# Patient Record
Sex: Female | Born: 1980 | Race: Black or African American | Hispanic: No | Marital: Married | Smoking: Never smoker
Health system: Southern US, Community
[De-identification: ages and names within clinical notes are randomized; demographics above are authoritative.]

## PROBLEM LIST (undated history)

## (undated) DIAGNOSIS — D573 Sickle-cell trait: Secondary | ICD-10-CM

## (undated) HISTORY — DX: Sickle-cell trait: D57.3

## (undated) HISTORY — PX: NO PAST SURGERIES: SHX2092

---

## 2004-05-18 ENCOUNTER — Ambulatory Visit (HOSPITAL_COMMUNITY): Admission: RE | Admit: 2004-05-18 | Discharge: 2004-05-18 | Payer: Self-pay | Admitting: *Deleted

## 2004-07-04 ENCOUNTER — Inpatient Hospital Stay (HOSPITAL_COMMUNITY): Admission: AD | Admit: 2004-07-04 | Discharge: 2004-07-05 | Payer: Self-pay | Admitting: *Deleted

## 2004-07-13 ENCOUNTER — Ambulatory Visit: Payer: Self-pay | Admitting: *Deleted

## 2004-07-13 ENCOUNTER — Inpatient Hospital Stay (HOSPITAL_COMMUNITY): Admission: AD | Admit: 2004-07-13 | Discharge: 2004-07-16 | Payer: Self-pay | Admitting: *Deleted

## 2004-07-13 ENCOUNTER — Ambulatory Visit: Payer: Self-pay | Admitting: Obstetrics and Gynecology

## 2005-07-23 ENCOUNTER — Inpatient Hospital Stay (HOSPITAL_COMMUNITY): Admission: AD | Admit: 2005-07-23 | Discharge: 2005-07-23 | Payer: Self-pay | Admitting: *Deleted

## 2005-09-02 ENCOUNTER — Ambulatory Visit (HOSPITAL_COMMUNITY): Admission: RE | Admit: 2005-09-02 | Discharge: 2005-09-02 | Payer: Self-pay | Admitting: Obstetrics & Gynecology

## 2005-09-03 ENCOUNTER — Encounter: Admission: RE | Admit: 2005-09-03 | Discharge: 2005-09-03 | Payer: Self-pay | Admitting: *Deleted

## 2005-09-10 ENCOUNTER — Ambulatory Visit: Payer: Self-pay | Admitting: *Deleted

## 2005-12-17 ENCOUNTER — Ambulatory Visit (HOSPITAL_COMMUNITY): Admission: RE | Admit: 2005-12-17 | Discharge: 2005-12-17 | Payer: Self-pay | Admitting: Obstetrics & Gynecology

## 2006-02-03 ENCOUNTER — Ambulatory Visit: Payer: Self-pay | Admitting: Certified Nurse Midwife

## 2006-02-03 ENCOUNTER — Inpatient Hospital Stay (HOSPITAL_COMMUNITY): Admission: AD | Admit: 2006-02-03 | Discharge: 2006-02-05 | Payer: Self-pay | Admitting: Obstetrics and Gynecology

## 2007-01-30 ENCOUNTER — Encounter: Admission: RE | Admit: 2007-01-30 | Discharge: 2007-01-30 | Payer: Self-pay | Admitting: *Deleted

## 2009-08-03 ENCOUNTER — Ambulatory Visit (HOSPITAL_COMMUNITY): Admission: RE | Admit: 2009-08-03 | Discharge: 2009-08-03 | Payer: Self-pay | Admitting: Family Medicine

## 2009-08-07 ENCOUNTER — Encounter: Admission: RE | Admit: 2009-08-07 | Discharge: 2009-08-07 | Payer: Self-pay | Admitting: Family Medicine

## 2009-12-21 ENCOUNTER — Inpatient Hospital Stay (HOSPITAL_COMMUNITY): Admission: AD | Admit: 2009-12-21 | Discharge: 2009-12-22 | Payer: Self-pay | Admitting: Family Medicine

## 2009-12-22 ENCOUNTER — Inpatient Hospital Stay (HOSPITAL_COMMUNITY): Admission: AD | Admit: 2009-12-22 | Discharge: 2009-12-24 | Payer: Self-pay | Admitting: Family Medicine

## 2010-07-15 ENCOUNTER — Encounter: Payer: Self-pay | Admitting: *Deleted

## 2010-09-09 LAB — CBC
HCT: 38.5 % (ref 36.0–46.0)
Hemoglobin: 13 g/dL (ref 12.0–15.0)
MCH: 28.8 pg (ref 26.0–34.0)
MCHC: 33.8 g/dL (ref 30.0–36.0)
MCV: 85.3 fL (ref 78.0–100.0)
Platelets: 129 10*3/uL — ABNORMAL LOW (ref 150–400)
RBC: 4.52 MIL/uL (ref 3.87–5.11)
RDW: 15.8 % — ABNORMAL HIGH (ref 11.5–15.5)
WBC: 7.5 10*3/uL (ref 4.0–10.5)

## 2010-09-09 LAB — RPR: RPR Ser Ql: NONREACTIVE

## 2011-07-31 ENCOUNTER — Other Ambulatory Visit: Payer: Self-pay

## 2011-07-31 ENCOUNTER — Encounter (HOSPITAL_BASED_OUTPATIENT_CLINIC_OR_DEPARTMENT_OTHER): Payer: Self-pay | Admitting: Emergency Medicine

## 2011-07-31 ENCOUNTER — Emergency Department (HOSPITAL_BASED_OUTPATIENT_CLINIC_OR_DEPARTMENT_OTHER)
Admission: EM | Admit: 2011-07-31 | Discharge: 2011-08-01 | Disposition: A | Discharge status: Home / Self Care | Payer: Self-pay | Attending: Emergency Medicine | Admitting: Emergency Medicine

## 2011-07-31 ENCOUNTER — Emergency Department (INDEPENDENT_AMBULATORY_CARE_PROVIDER_SITE_OTHER): Payer: Self-pay

## 2011-07-31 DIAGNOSIS — R059 Cough, unspecified: Secondary | ICD-10-CM | POA: Insufficient documentation

## 2011-07-31 DIAGNOSIS — R091 Pleurisy: Secondary | ICD-10-CM

## 2011-07-31 DIAGNOSIS — R05 Cough: Secondary | ICD-10-CM | POA: Insufficient documentation

## 2011-07-31 DIAGNOSIS — R0781 Pleurodynia: Secondary | ICD-10-CM

## 2011-07-31 DIAGNOSIS — M549 Dorsalgia, unspecified: Secondary | ICD-10-CM | POA: Insufficient documentation

## 2011-07-31 DIAGNOSIS — R071 Chest pain on breathing: Secondary | ICD-10-CM | POA: Insufficient documentation

## 2011-07-31 NOTE — ED Provider Notes (Signed)
History     CSN: [id number]  Arrival date & time 07/31/11  2040   First MD Initiated Contact with Patient 07/31/11 2249      Chief Complaint  Patient presents with  . Chest Pain  . Back Pain  . Cough    (Consider location/radiation/quality/duration/timing/severity/associated sxs/prior treatment) HPI Comments: Patient describes several months of right upper back pain. This has been worse with carrying her young baby who she states has to be held constantly. It is also worse when she stretches, bends over or takes a big breath. Until recently she had her husband give her back rub which improve the pain. Symptoms are intermittent, mild to moderate, gradually getting worse, not associated with fevers chills swelling of the legs. She has had a mild cough today but denies any travel, trauma, recent surgery or hormone pill use. She does not smoke cigarettes. She denies swelling of the legs  Patient is a 31 y.o. female presenting with chest pain, back pain, and cough. The history is provided by the patient and the spouse.  Chest Pain Primary symptoms include cough.    Back Pain  Associated symptoms include chest pain.  Cough Associated symptoms include chest pain.    History reviewed. No pertinent past medical history.  History reviewed. No pertinent past surgical history.  No family history on file.  History  Substance Use Topics  . Smoking status: Never Smoker   . Smokeless tobacco: Not on file  . Alcohol Use: No    OB History    Grav Para Term Preterm Abortions TAB SAB Ect Mult Living                  Review of Systems  Respiratory: Positive for cough.   Cardiovascular: Positive for chest pain.  Musculoskeletal: Positive for back pain.  All other systems reviewed and are negative.    Allergies  Review of patient's allergies indicates no known allergies.  Home Medications   Current Outpatient Rx  Name Route Sig Dispense Refill  . NAPROXEN 500 MG PO TABS Oral  Take 1 tablet (500 mg total) by mouth 2 (two) times daily with a meal. 30 tablet 0    BP 129/88  Pulse 85  Temp(Src) 98.7 F (37.1 C) (Oral)  Resp 18  SpO2 100%  LMP 07/21/2011  Physical Exam  Nursing note and vitals reviewed. Constitutional: She appears well-developed and well-nourished. No distress.  HENT:  Head: Normocephalic and atraumatic.  Mouth/Throat: Oropharynx is clear and moist. No oropharyngeal exudate.  Eyes: Conjunctivae and EOM are normal. Pupils are equal, round, and reactive to light. Right eye exhibits no discharge. Left eye exhibits no discharge. No scleral icterus.  Neck: Normal range of motion. Neck supple. No JVD present. No thyromegaly present.  Cardiovascular: Normal rate, regular rhythm, normal heart sounds and intact distal pulses.  Exam reveals no gallop and no friction rub.   No murmur heard. Pulmonary/Chest: Effort normal and breath sounds normal. No respiratory distress. She has no wheezes. She has no rales. She exhibits tenderness.       Tenderness over the sternum, inframammary area over the rib  Abdominal: Soft. Bowel sounds are normal. She exhibits no distension and no mass. There is no tenderness.  Musculoskeletal: Normal range of motion. She exhibits tenderness ( Tender to palpation in the right rhomboid area. She states this reproduces her pain). She exhibits no edema.  Lymphadenopathy:    She has no cervical adenopathy.  Neurological: She is alert. Coordination normal.  Skin: Skin is warm and dry. No rash noted. No erythema.  Psychiatric: She has a normal mood and affect. Her behavior is normal.    ED Course  Procedures (including critical care time)  ED ECG REPORT   Date: 08/01/2011   Rate: 83  Rhythm: normal sinus rhythm  QRS Axis: normal  Intervals: normal  ST/T Wave abnormalities: normal  Conduction Disutrbances:none  Narrative Interpretation: Normal ECG  Old EKG Reviewed: none available      Labs Reviewed - No data to  display Dg Chest 2 View  07/31/2011  *RADIOLOGY REPORT*  Clinical Data: Pleurisy  CHEST - 2 VIEW  Comparison: None.  Findings: Lungs are clear. No pleural effusion or pneumothorax. The cardiomediastinal contours are within normal limits. The visualized bones and soft tissues are without significant appreciable abnormality.  IMPRESSION: No acute cardiopulmonary process.  Original Report Authenticated By: Waneta Martins, M.D.     1. Pleuritic chest pain       MDM  Patient is well-appearing with an exam consistent with musculoskeletal type pain. She has no risk factors for pulmonary embolism, vital signs show oxygen of 100%, pulse of 85, respirations of 18 and afebrile. Lung exam is clear showing no rales wheezing or abnormal findings. EKG is nonischemic and shows no signs of tachycardia or S1 q 3 T3. Chest x-ray to rule out other source of pain.      Patient appears well, chest x-ray as evaluated by myself and radiologist shows no signs of acute findings. Ibuprofen given prior to discharge, Naprosyn for home  Vida Roller, MD 08/01/11 0020

## 2011-07-31 NOTE — ED Notes (Signed)
Pt c/o chest pain and pain between shoulder blades intermittent x 1 week. Pt reports cough starting today. Chest pain and back pain worsen with movement

## 2011-08-01 MED ORDER — NAPROXEN 500 MG PO TABS: 500.0000 mg | ORAL_TABLET | Freq: Two times a day (BID) | ORAL | Status: AC

## 2011-08-01 MED ORDER — IBUPROFEN 800 MG PO TABS
800.0000 mg | ORAL_TABLET | Freq: Once | ORAL | Status: AC
  Administered 2011-08-01: 800 mg via ORAL
  Filled 2011-08-01: qty 1

## 2012-07-01 ENCOUNTER — Encounter (HOSPITAL_COMMUNITY): Payer: Self-pay | Admitting: *Deleted

## 2012-07-07 ENCOUNTER — Ambulatory Visit (HOSPITAL_COMMUNITY)
Admission: RE | Admit: 2012-07-07 | Discharge: 2012-07-07 | Disposition: A | Discharge status: Home / Self Care | Payer: Self-pay | Source: Ambulatory Visit | Attending: Obstetrics and Gynecology | Admitting: Obstetrics and Gynecology

## 2012-07-07 ENCOUNTER — Encounter (HOSPITAL_COMMUNITY): Payer: Self-pay

## 2012-07-07 ENCOUNTER — Other Ambulatory Visit: Payer: Self-pay | Admitting: Obstetrics and Gynecology

## 2012-07-07 VITALS — BP 100/62 | Temp 98.4°F | Ht 64.0 in | Wt 144.8 lb

## 2012-07-07 DIAGNOSIS — N644 Mastodynia: Secondary | ICD-10-CM | POA: Insufficient documentation

## 2012-07-07 DIAGNOSIS — Z1239 Encounter for other screening for malignant neoplasm of breast: Secondary | ICD-10-CM

## 2012-07-07 DIAGNOSIS — N6322 Unspecified lump in the left breast, upper inner quadrant: Secondary | ICD-10-CM | POA: Insufficient documentation

## 2012-07-07 NOTE — Patient Instructions (Signed)
Taught patient how to perform BSE and gave educational materials to take home. Patient did not need a Pap smear today due to last Pap smear was November 2013 per patient. Let her know B[facility]CP will cover Pap smears every 3 years unless has a history of abnormal Pap smears. Referred patient to the [facility] for bilateral diagnostic mammogram and possible ultrasound. Appointment scheduled for Thursday, July 16, 2012 at 0840. Patient aware of appointment and will be there. Patient verbalized understanding.

## 2012-07-07 NOTE — Progress Notes (Signed)
Patient referred to [facility] by the [facility] Department for a left breast lump. Per patient lump has been there since 2007. Patient complained of bilateral breast pain in her right inner breast and her left where the lump is located. Patient stated pain comes and goes and rated at a 6 out of 10.  Pap Smear:    Pap smear not completed today. Per patient last Pap smear was November 2013 at the [facility] Department and normal. Per patient no history of abnormal Pap smears. No Pap smear results is in EPIC.  Physical exam: Breasts Breasts symmetrical. No skin abnormalities bilateral breasts. No nipple retraction bilateral breasts. No nipple discharge bilateral breasts. No lymphadenopathy. No lumps palpated right breast. Palpated lump within the left breast at 10 o'clock 8 cm from the nipple. Patient complained of right inner quadrant breast pain and left breast pain where lump is located. Referred patient to the [facility] for bilateral diagnostic mammogram and possible ultrasound. Appointment scheduled for Thursday, July 16, 2012 at 0840.     Pelvic/Bimanual No Pap smear completed today since last Pap smear was November 2013 per patient. Pap smear not indicated per BCCCP guidelines.

## 2012-07-16 ENCOUNTER — Ambulatory Visit
Admission: RE | Admit: 2012-07-16 | Discharge: 2012-07-16 | Disposition: A | Discharge status: Home / Self Care | Payer: No Typology Code available for payment source | Source: Ambulatory Visit | Attending: Obstetrics and Gynecology | Admitting: Obstetrics and Gynecology

## 2012-07-16 DIAGNOSIS — N6322 Unspecified lump in the left breast, upper inner quadrant: Secondary | ICD-10-CM

## 2012-07-16 DIAGNOSIS — N644 Mastodynia: Secondary | ICD-10-CM

## 2014-04-25 ENCOUNTER — Encounter (HOSPITAL_COMMUNITY): Payer: Self-pay

## 2015-04-06 ENCOUNTER — Other Ambulatory Visit (HOSPITAL_COMMUNITY): Payer: Self-pay | Admitting: Physician Assistant

## 2015-04-06 DIAGNOSIS — Z3A18 18 weeks gestation of pregnancy: Secondary | ICD-10-CM

## 2015-04-06 DIAGNOSIS — Z3689 Encounter for other specified antenatal screening: Secondary | ICD-10-CM

## 2015-04-10 ENCOUNTER — Encounter (HOSPITAL_COMMUNITY): Payer: Self-pay

## 2015-05-09 ENCOUNTER — Encounter (HOSPITAL_COMMUNITY): Payer: Self-pay

## 2015-05-09 ENCOUNTER — Ambulatory Visit (HOSPITAL_COMMUNITY)
Admission: RE | Admit: 2015-05-09 | Discharge: 2015-05-09 | Disposition: A | Discharge status: Home / Self Care | Payer: Self-pay | Source: Ambulatory Visit | Attending: Physician Assistant | Admitting: Physician Assistant

## 2015-05-09 ENCOUNTER — Other Ambulatory Visit (HOSPITAL_COMMUNITY): Payer: Self-pay | Admitting: Physician Assistant

## 2015-05-09 DIAGNOSIS — Z3689 Encounter for other specified antenatal screening: Secondary | ICD-10-CM

## 2015-05-09 DIAGNOSIS — Z3A19 19 weeks gestation of pregnancy: Secondary | ICD-10-CM | POA: Insufficient documentation

## 2015-05-09 DIAGNOSIS — O3412 Maternal care for benign tumor of corpus uteri, second trimester: Secondary | ICD-10-CM

## 2015-05-09 DIAGNOSIS — Z3A18 18 weeks gestation of pregnancy: Secondary | ICD-10-CM

## 2015-05-09 DIAGNOSIS — O09522 Supervision of elderly multigravida, second trimester: Secondary | ICD-10-CM

## 2015-05-09 DIAGNOSIS — Z36 Encounter for antenatal screening of mother: Secondary | ICD-10-CM | POA: Insufficient documentation

## 2015-05-09 DIAGNOSIS — D259 Leiomyoma of uterus, unspecified: Secondary | ICD-10-CM

## 2015-06-25 NOTE — L&D Delivery Note (Cosign Needed)
  Angela Boyle, Angela Boyle [id number]  Delivery Note  Patient arrived to MAU complete, membranes then ruptured, and she delivered shortly thereafter. Dr. Ruthann Cancer immediately available and performed delivery; I assumed care after delivery and managed the remainder.  At  a viable female was delivered via Vaginal, Spontaneous Delivery (Presentation: OA).  APGAR: 7, 9; weight 3660 g.   Placenta status: Intact, Spontaneous.  Cord:  with the following complications: None.  Cord pH: not obtained  Anesthesia: None  Episiotomy: None Lacerations: None Est. Blood Loss (mL):  150  Mom to postpartum.  Baby to Couplet care / Skin to Skin.  [name] 10/01/2015, 7:30 AM

## 2015-10-01 ENCOUNTER — Inpatient Hospital Stay (HOSPITAL_COMMUNITY)
Admission: AD | Admit: 2015-10-01 | Discharge: 2015-10-03 | DRG: 775 | Disposition: A | Discharge status: Home / Self Care | Payer: Medicaid Other | Source: Ambulatory Visit | Attending: Obstetrics & Gynecology | Admitting: Obstetrics & Gynecology

## 2015-10-01 DIAGNOSIS — Z23 Encounter for immunization: Secondary | ICD-10-CM

## 2015-10-01 DIAGNOSIS — Z3A39 39 weeks gestation of pregnancy: Secondary | ICD-10-CM

## 2015-10-01 DIAGNOSIS — O99824 Streptococcus B carrier state complicating childbirth: Secondary | ICD-10-CM | POA: Diagnosis not present

## 2015-10-01 DIAGNOSIS — Z3490 Encounter for supervision of normal pregnancy, unspecified, unspecified trimester: Secondary | ICD-10-CM

## 2015-10-01 LAB — CBC
HCT: 36.1 % (ref 36.0–46.0)
HEMOGLOBIN: 12.6 g/dL (ref 12.0–15.0)
MCH: 27.5 pg (ref 26.0–34.0)
MCHC: 34.9 g/dL (ref 30.0–36.0)
MCV: 78.6 fL (ref 78.0–100.0)
Platelets: 129 10*3/uL — ABNORMAL LOW (ref 150–400)
RBC: 4.59 MIL/uL (ref 3.87–5.11)
RDW: 15.7 % — ABNORMAL HIGH (ref 11.5–15.5)
WBC: 5.4 10*3/uL (ref 4.0–10.5)

## 2015-10-01 LAB — RPR: RPR: NONREACTIVE

## 2015-10-01 MED ORDER — ONDANSETRON HCL 4 MG PO TABS: 4.0000 mg | ORAL_TABLET | ORAL | Status: DC | PRN

## 2015-10-01 MED ORDER — BENZOCAINE-MENTHOL 20-0.5 % EX AERO
1.0000 "application " | INHALATION_SPRAY | CUTANEOUS | Status: DC | PRN
  Filled 2015-10-01: qty 56

## 2015-10-01 MED ORDER — OXYTOCIN 10 UNIT/ML IJ SOLN
INTRAMUSCULAR | Status: AC
  Filled 2015-10-01: qty 1

## 2015-10-01 MED ORDER — ONDANSETRON HCL 4 MG/2ML IJ SOLN: 4.0000 mg | INTRAMUSCULAR | Status: DC | PRN

## 2015-10-01 MED ORDER — TETANUS-DIPHTH-ACELL PERTUSSIS 5-2.5-18.5 LF-MCG/0.5 IM SUSP: 0.5000 mL | Freq: Once | INTRAMUSCULAR | Status: DC

## 2015-10-01 MED ORDER — ACETAMINOPHEN 325 MG PO TABS
650.0000 mg | ORAL_TABLET | ORAL | Status: DC | PRN
  Administered 2015-10-01 (×2): 650 mg via ORAL
  Filled 2015-10-01 (×2): qty 2

## 2015-10-01 MED ORDER — OXYCODONE-ACETAMINOPHEN 5-325 MG PO TABS
2.0000 | ORAL_TABLET | ORAL | Status: DC | PRN
  Filled 2015-10-01: qty 2

## 2015-10-01 MED ORDER — OXYCODONE-ACETAMINOPHEN 5-325 MG PO TABS: 1.0000 | ORAL_TABLET | ORAL | Status: DC | PRN

## 2015-10-01 MED ORDER — ONDANSETRON HCL 4 MG/2ML IJ SOLN: 4.0000 mg | Freq: Four times a day (QID) | INTRAMUSCULAR | Status: DC | PRN

## 2015-10-01 MED ORDER — LANOLIN HYDROUS EX OINT: TOPICAL_OINTMENT | CUTANEOUS | Status: DC | PRN

## 2015-10-01 MED ORDER — WITCH HAZEL-GLYCERIN EX PADS: 1.0000 "application " | MEDICATED_PAD | CUTANEOUS | Status: DC | PRN

## 2015-10-01 MED ORDER — IBUPROFEN 600 MG PO TABS
600.0000 mg | ORAL_TABLET | Freq: Four times a day (QID) | ORAL | Status: DC
  Administered 2015-10-01 – 2015-10-03 (×7): 600 mg via ORAL
  Filled 2015-10-01 (×8): qty 1

## 2015-10-01 MED ORDER — MEASLES, MUMPS & RUBELLA VAC ~~LOC~~ INJ
0.5000 mL | INJECTION | Freq: Once | SUBCUTANEOUS | Status: DC
  Filled 2015-10-01: qty 0.5

## 2015-10-01 MED ORDER — DIPHENHYDRAMINE HCL 25 MG PO CAPS: 25.0000 mg | ORAL_CAPSULE | Freq: Four times a day (QID) | ORAL | Status: DC | PRN

## 2015-10-01 MED ORDER — DIBUCAINE 1 % RE OINT
1.0000 "application " | TOPICAL_OINTMENT | RECTAL | Status: DC | PRN
  Filled 2015-10-01: qty 28

## 2015-10-01 MED ORDER — ZOLPIDEM TARTRATE 5 MG PO TABS: 5.0000 mg | ORAL_TABLET | Freq: Every evening | ORAL | Status: DC | PRN

## 2015-10-01 MED ORDER — SIMETHICONE 80 MG PO CHEW
80.0000 mg | CHEWABLE_TABLET | ORAL | Status: DC | PRN
  Filled 2015-10-01: qty 1

## 2015-10-01 MED ORDER — LACTATED RINGERS IV SOLN: INTRAVENOUS | Status: DC

## 2015-10-01 MED ORDER — PRENATAL MULTIVITAMIN CH
1.0000 | ORAL_TABLET | Freq: Every day | ORAL | Status: DC
  Administered 2015-10-01 – 2015-10-02 (×2): 1 via ORAL
  Filled 2015-10-01 (×3): qty 1

## 2015-10-01 MED ORDER — OXYTOCIN 10 UNIT/ML IJ SOLN: 2.5000 [IU]/h | INTRAVENOUS | Status: DC

## 2015-10-01 MED ORDER — LACTATED RINGERS IV SOLN: 500.0000 mL | INTRAVENOUS | Status: DC | PRN

## 2015-10-01 MED ORDER — SENNOSIDES-DOCUSATE SODIUM 8.6-50 MG PO TABS
2.0000 | ORAL_TABLET | ORAL | Status: DC
  Administered 2015-10-02 (×2): 2 via ORAL
  Filled 2015-10-01 (×2): qty 2

## 2015-10-01 MED ORDER — SODIUM CHLORIDE 0.9 % IV SOLN
2.0000 g | Freq: Once | INTRAVENOUS | Status: DC
  Filled 2015-10-01: qty 2000

## 2015-10-01 MED ORDER — OXYTOCIN BOLUS FROM INFUSION: 500.0000 mL | INTRAVENOUS | Status: DC

## 2015-10-01 MED ORDER — LIDOCAINE HCL (PF) 1 % IJ SOLN: 30.0000 mL | INTRAMUSCULAR | Status: DC | PRN

## 2015-10-01 MED ORDER — CITRIC ACID-SODIUM CITRATE 334-500 MG/5ML PO SOLN: 30.0000 mL | ORAL | Status: DC | PRN

## 2015-10-01 MED ORDER — ACETAMINOPHEN 325 MG PO TABS: 650.0000 mg | ORAL_TABLET | ORAL | Status: DC | PRN

## 2015-10-01 MED ORDER — FLEET ENEMA 7-19 GM/118ML RE ENEM: 1.0000 | ENEMA | RECTAL | Status: DC | PRN

## 2015-10-01 NOTE — MAU Note (Signed)
Pt presents to MAU with complaints of contractions that started at 5am. Denies any vaginal bleeding or LOF.

## 2015-10-01 NOTE — MAU Note (Signed)
Pt ROM with meconium fluid, urge to push, delivered viable female infant at [phone]

## 2015-10-01 NOTE — MAU Note (Signed)
Report given to [facility]

## 2015-10-01 NOTE — H&P (Cosign Needed)
LABOR AND DELIVERY ADMISSION HISTORY AND PHYSICAL NOTE  Leshea Labarr is a 35 y.o. female [id number] with IUP at [redacted]w[redacted]d by L/19 presenting for painful contractions.   She reports positive fetal movement. She denies leakage of fluid or vaginal bleeding.  Prenatal History/Complications:  Past Medical History: No past medical history on file.  Past Surgical History: No past surgical history on file.  Obstetrical History: OB History    Gravida Para Term Preterm AB TAB SAB Ectopic Multiple Living   5 4 4  0 0 0 0 0  4      Social History: Social History   Social History  . Marital Status: Married    Spouse Name: N/A  . Number of Children: N/A  . Years of Education: N/A   Social History Main Topics  . Smoking status: Never Smoker   . Smokeless tobacco: Never Used  . Alcohol Use: No  . Drug Use: No  . Sexual Activity: Not on file   Other Topics Concern  . Not on file   Social History Narrative   ** Merged History Encounter **        Family History: Family History  Problem Relation Age of Onset  . Hypertension Mother     Allergies: No Known Allergies  Prescriptions prior to admission  Medication Sig Dispense Refill Last Dose  . Multiple Vitamin (MULTIVITAMIN) tablet Take 1 tablet by mouth daily.        Review of Systems   All systems reviewed and negative except as stated in HPI  Last menstrual period 12/28/2014. General appearance: alert, cooperative, appears stated age and moderate distress Lungs: clear to auscultation bilaterally Heart: regular rate and rhythm Abdomen: soft, non-tender; bowel sounds normal Extremities: No calf swelling or tenderness Presentation: cephalic Fetal monitoring: 140s Uterine activity: q  3 min     Prenatal labs: ABO, Rh:  b pos Antibody:  neg Rubella: !Error!imm RPR:   neg HBsAg:   pos HIV:   neg GBS:   pos 1 hr Glucola:  85 Genetic screening:  Quad neg Anatomy US: wnl  Prenatal Transfer Tool  Maternal  Diabetes: No Genetic Screening: Normal Maternal Ultrasounds/Referrals: Normal Fetal Ultrasounds or other Referrals:  None Maternal Substance Abuse:  No Significant Maternal Medications:  None Significant Maternal Lab Results: Lab values include: Group B Strep positive, HBsAG positive  No results found for this or any previous visit (from the past 24 hour(s)).  Patient Active Problem List   Diagnosis Date Noted  . Pregnancy 10/01/2015  . Breast lump on left side at 10 o'clock position 07/07/2012  . Breast pain in female 07/07/2012    Assessment: Joanny Vanderheiden is a 35 y.o. [id number] at [redacted]w[redacted]d here for active labor. Arrived to MAU complete and delivered a few minutes later, uncomplicated delivery.  #Labor: pitocin 10 units IM  #ID:  gbs pos, not treated #MOF: breast/bottle #MOC: need to inquire #Circ:  N/a #hep b: peds made aware through prenatal transfer tool  Desma Maxim 10/01/2015, 7:28 AM

## 2015-10-01 NOTE — Lactation Note (Signed)
This note was copied from a baby's chart. Lactation Consultation Note  Patient Name: Angela Boyle Today's Date: 10/01/2015 Reason for consult: Initial assessment Baby at 42 hr of life and mom reports bf is going well. Upon entry mom was eating and baby was sleeping. She bf all 4 of her other children over 12 months. She stated that on day 2 with each of her other children her milk comes in and her nipples go flat. She stated that she uses a hand pump to get the nipple more erect and the babies latch with no issues. Given Harmony. She denies breast or nipple pain, voiced no concerns. Discussed baby behavior, feeding frequency, baby belly size, voids, wt loss, breast changes, and nipple care. Demonstrated manual expression, colostrum noted bilaterally, spoon in room. Given lactation handouts. Aware of OP services and support group. Mom will call as needed.      Maternal Data Has patient been taught Hand Expression?: Yes Does the patient have breastfeeding experience prior to this delivery?: Yes  Feeding Feeding Type: Breast Fed Length of feed: 35 min  LATCH Score/Interventions                      Lactation Tools Discussed/Used WIC Program: No Pump Review: Setup, frequency, and cleaning;Milk Storage Initiated by:: ES Date initiated:: 10/01/15   Consult Status Consult Status: PRN    Denzil Hughes 10/01/2015, 6:56 PM

## 2015-10-02 ENCOUNTER — Encounter (HOSPITAL_COMMUNITY): Payer: Self-pay | Admitting: *Deleted

## 2015-10-02 NOTE — Progress Notes (Signed)
Post Partum Day 1 Subjective: no complaints, up ad lib, voiding and tolerating PO  Objective: Blood pressure 118/72, pulse 72, temperature 98.5 F (36.9 C), temperature source Oral, resp. rate 17, last menstrual period 12/28/2014.  Physical Exam:  General: alert, cooperative, appears stated age and no distress Lochia: appropriate Uterine Fundus: firm Incision: n/a DVT Evaluation: No evidence of DVT seen on physical exam. Negative Homan's sign. No cords or calf tenderness.   Recent Labs  10/01/15 0729  HGB 12.6  HCT 36.1    Assessment/Plan: Plan for discharge tomorrow   LOS: 1 day   Koren Shiver DARLENE 10/02/2015, 6:24 AM

## 2015-10-03 MED ORDER — IBUPROFEN 600 MG PO TABS: 600.0000 mg | ORAL_TABLET | Freq: Four times a day (QID) | ORAL | Status: DC

## 2015-10-03 NOTE — Lactation Note (Signed)
This note was copied from a baby's chart. Lactation Consultation Note  Patient Name: Angela Boyle Today's Date: 10/03/2015 Reason for consult: Follow-up assessment   With this experienced breast feeding mom - this is her 5th time breastfeeidng. She denies andy questions/concerns, but knows to call lactation if she does.    Maternal Data    Feeding Feeding Type: Breast Fed  LATCH Score/Interventions Latch: Grasps breast easily, tongue down, lips flanged, rhythmical sucking.  Audible Swallowing: Spontaneous and intermittent  Type of Nipple: Everted at rest and after stimulation  Comfort (Breast/Nipple): Soft / non-tender     Hold (Positioning): Assistance needed to correctly position infant at breast and maintain latch. Intervention(s): Support Pillows  LATCH Score: 9  Lactation Tools Discussed/Used     Consult Status Consult Status: Complete    Tonna Corner 10/03/2015, 11:13 AM

## 2015-10-03 NOTE — Discharge Summary (Signed)
OB Discharge Summary  Patient Name: Angela Boyle DOB: 12-Dec-1980 MRN: [id number]  Date of admission: 10/01/2015 Delivering MD: Gracy Racer A   Date of discharge: 10/03/2015  Admitting diagnosis: 39WKS, CONTRACTIONS 3-4MINS  Intrauterine pregnancy: [redacted]w[redacted]d     Secondary diagnosis:Active Problems:   Pregnancy   Term pregnancy delivered  Additional problems: Arrived in active labor and delivered in the MAU a few minutes later, uncomplicated delivery. Pt was GBS positive and was untreated.    Discharge diagnosis: Term Pregnancy Delivered                                                                     Post partum procedures:none  Augmentation: none  Complications: None  Hospital course:  Onset of Labor With Vaginal Delivery     35 y.o. yo G5P5005 at [redacted]w[redacted]d was admitted in Active Labor on 10/01/2015. Patient had an uncomplicated labor course as follows:  Membrane Rupture Time/Date: 7:12 AM ,10/01/2015   Intrapartum Procedures: Episiotomy: None [1]                                         Lacerations:  None [1]  Patient had a delivery of a Viable infant. 10/01/2015  Information for the patient's newborn:  Angela, Boyle Angela Boyle [id number]        Pateint had an uncomplicated postpartum course.  She is ambulating, tolerating a regular diet, passing flatus, and urinating well. Patient is discharged home in stable condition on 10/03/2015.    Physical exam  Filed Vitals:   10/01/15 2104 10/02/15 0500 10/02/15 1710 10/03/15 0538  BP: 107/59 118/72 125/57 112/62  Pulse: 79 72 67 63  Temp: 98.9 F (37.2 C) 98.5 F (36.9 C) 98.9 F (37.2 C) 98.6 F (37 C)  TempSrc:  Oral Oral Oral  Resp: 18 17 18 16    General: alert, cooperative and no distress Lochia: appropriate Uterine Fundus: firm Incision: N/A DVT Evaluation: No evidence of DVT seen on physical exam. Labs: Lab Results  Component Value Date   WBC 5.4 10/01/2015   HGB 12.6 10/01/2015   HCT 36.1 10/01/2015    MCV 78.6 10/01/2015   PLT 129* 10/01/2015   No flowsheet data found.  Discharge instruction: per After Visit Summary and "Baby and Me Booklet".  After Visit Meds:    Medication List    TAKE these medications        ibuprofen 600 MG tablet  Commonly known as:  ADVIL,MOTRIN  Take 1 tablet (600 mg total) by mouth every 6 (six) hours.     prenatal multivitamin Tabs tablet  Take 1 tablet by mouth daily at 12 noon.     ranitidine 150 MG tablet  Commonly known as:  ZANTAC  Take 150 mg by mouth 2 (two) times daily.        Diet: routine diet  Activity: Advance as tolerated. Pelvic rest for 6 weeks.   Outpatient follow up:6 weeks Follow up Appt:No future appointments. Follow up visit: No Follow-up on file.  Postpartum contraception: Combination OCPs (Yasmin)  Newborn Data: Live born female  Birth Weight: 8 lb 1.1 oz (3660 g) APGAR:  7, 9  Baby Feeding: Bottle and Breast Disposition:home with mother   10/03/2015 Angela Boyle, CNM

## 2015-10-03 NOTE — Discharge Instructions (Signed)

## 2015-10-03 NOTE — Discharge Summary (Signed)
OB Discharge Summary  Patient Name: Angela Boyle DOB: [birth year] MRN: [id number]  Date of admission: 10/01/2015 Delivering MD: Gracy Racer A   Date of discharge: 10/03/2015  Admitting diagnosis: 39WKS, CONTRACTIONS 3-4MINS  Intrauterine pregnancy: [redacted]w[redacted]d     Secondary diagnosis:Active Problems:   Pregnancy   Term pregnancy delivered  Additional problems: Arrived in active labor and delivered in the MAU a few minutes later, uncomplicated delivery. Pt was GBS positive and was untreated.    Discharge diagnosis: Term Pregnancy Delivered                                                                     Post partum procedures:none  Augmentation: none  Complications: None  Hospital course:  Onset of Labor With Vaginal Delivery     35 y.o. yo G5P5005 at [redacted]w[redacted]d was admitted in Active Labor on 10/01/2015. Patient had an uncomplicated labor course as follows:  Membrane Rupture Time/Date: 7:12 AM ,10/01/2015   Intrapartum Procedures: Episiotomy: None [1]                                         Lacerations:  None [1]  Patient had a delivery of a Viable infant. 10/01/2015  Information for the patient's newborn:  Ayani, Hackl Girl Emillee [id number]       Pateint had an uncomplicated postpartum course.  She is ambulating, tolerating a regular diet, passing flatus, and urinating well. Patient is discharged home in stable condition on 10/03/2015.    Physical exam  Filed Vitals:   10/01/15 2104 10/02/15 0500 10/02/15 1710 10/03/15 0538  BP: 107/59 118/72 125/57 112/62  Pulse: 79 72 67 63  Temp: 98.9 F (37.2 C) 98.5 F (36.9 C) 98.9 F (37.2 C) 98.6 F (37 C)  TempSrc:  Oral Oral Oral  Resp: 18 17 18 16    General: alert, cooperative and no distress Lochia: appropriate Uterine Fundus: firm Incision: N/A DVT Evaluation: No evidence of DVT seen on physical exam. Labs: Lab Results  Component Value Date   WBC 5.4 10/01/2015   HGB 12.6 10/01/2015   HCT 36.1 10/01/2015    MCV 78.6 10/01/2015   PLT 129* 10/01/2015   No flowsheet data found.  Discharge instruction: per After Visit Summary and "Baby and Me Booklet".  After Visit Meds:    Medication List    TAKE these medications        ibuprofen 600 MG tablet  Commonly known as:  ADVIL,MOTRIN  Take 1 tablet (600 mg total) by mouth every 6 (six) hours.     prenatal multivitamin Tabs tablet  Take 1 tablet by mouth daily at 12 noon.     ranitidine 150 MG tablet  Commonly known as:  ZANTAC  Take 150 mg by mouth 2 (two) times daily.        Diet: routine diet  Activity: Advance as tolerated. Pelvic rest for 6 weeks.   Outpatient follow up:6 weeks Follow up Appt:No future appointments. Follow up visit: No Follow-up on file.  Postpartum contraception: Combination OCPs (Yasmin)  Newborn Data: Live born female  Birth Weight: 8 lb 1.1 oz (3660 g) APGAR: 7,  9  Baby Feeding: Bottle and Breast Disposition:home with mother   10/03/2015 Evette Doffing, MD   CNM attestation:  I have seen and examined this patient; I agree with above documentation in the Resident's note.    Lakeria Starkman Grissett, CNM 10:02 AM

## 2015-10-04 LAB — TYPE AND SCREEN
ABO/RH(D): B POS
Antibody Screen: POSITIVE
DAT, IGG: NEGATIVE
DONOR AG TYPE: NEGATIVE
Donor AG Type: NEGATIVE
PT AG TYPE: NEGATIVE
UNIT DIVISION: 0
Unit division: 0

## 2016-09-10 ENCOUNTER — Encounter: Payer: Self-pay | Admitting: Internal Medicine

## 2016-09-10 ENCOUNTER — Ambulatory Visit (INDEPENDENT_AMBULATORY_CARE_PROVIDER_SITE_OTHER): Payer: Self-pay | Admitting: Internal Medicine

## 2016-09-10 VITALS — BP 118/80 | HR 76 | Resp 12 | Ht 63.0 in | Wt 156.0 lb

## 2016-09-10 DIAGNOSIS — D573 Sickle-cell trait: Secondary | ICD-10-CM | POA: Insufficient documentation

## 2016-09-10 DIAGNOSIS — E010 Iodine-deficiency related diffuse (endemic) goiter: Secondary | ICD-10-CM

## 2016-09-10 DIAGNOSIS — Z3009 Encounter for other general counseling and advice on contraception: Secondary | ICD-10-CM

## 2016-09-10 NOTE — Patient Instructions (Addendum)
Can google "advance directives, [place]"  And bring up form from Secretary of [place]. Print and fill out Or can go to "5 wishes"  Which is also in Spanish and fill out--this costs $5--perhaps easier to use. Designate a Forensic scientist to speak for you if you are unable to speak for yourself when ill or injured  Call for illness you would like to be seen for or any health concern.

## 2016-09-10 NOTE — Progress Notes (Signed)
LCSW screened new pt for behavioral health concerns and social determinants of health. Pt shared that she is often tired due to parenting 5 children, but has no other stress or concerns at this time. LCSW shared the social work services available at the clinic if needed.

## 2016-09-10 NOTE — Progress Notes (Signed)
   Subjective:    Patient ID: Angela Boyle, female    DOB: Oct 22, 1980, 36 y.o.   MRN: [id number]  HPI   Here to establish  Family Planning:  Had an IUD before--had cramps and heavy bleeding.  Had prolonged bleeding with Depo Provera.  Prefers the pills.  She was on Micronor postpartum, but stopped when her baby girl was 4-5 months.  She is almost 12 months now.   She is not certain if her husband would consider vasectomy. She plans to go back to [facility] gynecology clinic, which is free as are the BCPs.   Current Meds  Medication Sig  . Multiple Vitamins-Calcium (ONE-A-DAY WOMENS FORMULA) TABS Take 1 tablet by mouth daily.   No Known Allergies   Past Medical History:  Diagnosis Date  . Sickle cell trait ([place])     History reviewed. No pertinent surgical history.   Family History  Problem Relation Age of Onset  . Hypertension Mother   . Hyperlipidemia Mother   . Hypertension Father   . Hypertension Brother   . Sickle cell trait Daughter   . Sickle cell trait Daughter     Social History   Social History  . Marital status: Married    Spouse name: Abdou  . Number of children: 5  . Years of education: 12   Occupational History  . 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 Smoking status: Never Smoker  . Smokeless tobacco: Never Used  . Alcohol use No  . Drug use: No  . Sexual activity: Yes    Birth control/ protection: Condom   Other Topics Concern  . Not on file   Social History Narrative   ** Merged History Encounter **   Originally from [place]   Came to [place] In 2005   Lives at home with her husband and 4 youngest children.   Oldest son lives in [place] with her mother.         Review of Systems     Objective:   Physical Exam  NAD HEENT:  PERRL, EOMI, TMs pearly gray, throat without injection, several missing teeth--states were pulled due to decay. Neck:  Supple, No adenopathy, possible thyromegaly vs. Redundant soft tissue overlying  thryoid. Chest:  CTA CV:  RRR with normal S1 and S2, No S3, S4 or murmur, radial and DP pulses normal and equal Abd:  S, NT, No HSM or mass, + BS       Assessment & Plan:  1.  Family Planning:  After discussion regarding $10 cost per month for Micronor at [facility], patient decides to keep her appt. With [facility] where she can obtain BCPs for free.  Encouraged her to make that appt.  Also discussed possible vasectomy for husband, but not clear if that would be covered with Urology through orange card.  She can certainly check to see if available through PHD as well.  2.  Possible thyromegaly:  TSH.

## 2016-09-11 LAB — TSH: TSH: 1.18 u[IU]/mL (ref 0.450–4.500)

## 2016-10-17 ENCOUNTER — Encounter (HOSPITAL_COMMUNITY): Payer: Self-pay

## 2019-06-25 NOTE — L&D Delivery Note (Addendum)
Angela Boyle is a 38 y.o.@ s/p vaginal delivery at [redacted]w[redacted]d.  She was admitted for labor in the setting IOL-iso-E immunization.    ROM: clear fluid GBS Status: negative Maximum Maternal Temperature: 99.5 F   Labor Progress: Pt in latent labor on admission, admitted for IOL. She continued to progress well without augmentation. Pitocin was also initiated and pt then was noted to have complete cervical dilation. She then delivered without complication as noted below.   Delivery Date/Time:  Delivery: Called to room and patient was complete and pushing. Head delivered ROA. No nuchal cord present. Shoulder and body delivered in usual fashion. Infant with spontaneous cry, placed on mother's abdomen, dried and stimulated. Cord clamped x 2 after 1-minute delay, and cut by me. Cord blood drawn. Placenta delivered spontaneously with gentle cord traction. Fundus firm with massage and Pitocin. Labia, perineum, vagina, and cervix were inspected; no lacerations visualized.    Placenta: intact, 3-vessel cord, sent to L&D Complications: none Lacerations: none EBL: 100 ml Analgesia: epidural   Infant: female  APGARs 9 & 9  weight per medical record  Donney Dice, DO  Midwife attestation: I was gloved and present for delivery in its entirety and I agree with the above resident's note.  Fatima Blank, CNM 9:25 AM

## 2020-01-03 ENCOUNTER — Other Ambulatory Visit: Payer: Self-pay | Admitting: Family

## 2020-01-03 DIAGNOSIS — Z363 Encounter for antenatal screening for malformations: Secondary | ICD-10-CM

## 2020-01-03 DIAGNOSIS — Z3A19 19 weeks gestation of pregnancy: Secondary | ICD-10-CM

## 2020-01-03 LAB — OB RESULTS CONSOLE ABO/RH: RH Type: POSITIVE

## 2020-01-03 LAB — OB RESULTS CONSOLE ANTIBODY SCREEN: Antibody Screen: POSITIVE

## 2020-01-03 LAB — GLUCOSE, 1 HOUR GESTATIONAL: Glucose, 1 Hour GTT: 125

## 2020-01-03 LAB — OB RESULTS CONSOLE PLATELET COUNT: Platelets: 160

## 2020-01-03 LAB — OB RESULTS CONSOLE GC/CHLAMYDIA
Chlamydia: NEGATIVE
Gonorrhea: NEGATIVE

## 2020-01-03 LAB — OB RESULTS CONSOLE RPR: RPR: NONREACTIVE

## 2020-01-03 LAB — OB RESULTS CONSOLE HGB/HCT, BLOOD
HCT: 35 (ref 29–41)
Hemoglobin: 11.7

## 2020-01-03 LAB — SICKLE CELL SCREEN

## 2020-01-03 LAB — OB RESULTS CONSOLE TSH: TSH: 1.45

## 2020-01-03 LAB — OB RESULTS CONSOLE RUBELLA ANTIBODY, IGM: Rubella: IMMUNE

## 2020-01-03 LAB — OB RESULTS CONSOLE HEPATITIS B SURFACE ANTIGEN: Hepatitis B Surface Ag: POSITIVE

## 2020-01-03 LAB — CYTOLOGY - PAP: Pap: NEGATIVE

## 2020-01-03 LAB — OB RESULTS CONSOLE HIV ANTIBODY (ROUTINE TESTING): HIV: NONREACTIVE

## 2020-01-03 LAB — HEPATITIS C ANTIBODY: HCV Ab: NEGATIVE

## 2020-01-06 ENCOUNTER — Other Ambulatory Visit: Payer: Self-pay

## 2020-01-10 ENCOUNTER — Encounter: Payer: Self-pay | Admitting: *Deleted

## 2020-01-12 ENCOUNTER — Ambulatory Visit: Payer: Self-pay | Admitting: Genetic Counselor

## 2020-01-12 ENCOUNTER — Other Ambulatory Visit: Payer: Self-pay | Admitting: *Deleted

## 2020-01-12 ENCOUNTER — Ambulatory Visit (HOSPITAL_BASED_OUTPATIENT_CLINIC_OR_DEPARTMENT_OTHER): Payer: Self-pay | Admitting: Genetic Counselor

## 2020-01-12 ENCOUNTER — Ambulatory Visit: Payer: Self-pay | Admitting: *Deleted

## 2020-01-12 ENCOUNTER — Other Ambulatory Visit: Payer: Self-pay

## 2020-01-12 ENCOUNTER — Ambulatory Visit: Payer: Self-pay | Attending: Family

## 2020-01-12 VITALS — BP 108/66 | HR 87 | Wt 178.0 lb

## 2020-01-12 DIAGNOSIS — Z362 Encounter for other antenatal screening follow-up: Secondary | ICD-10-CM

## 2020-01-12 DIAGNOSIS — O09522 Supervision of elderly multigravida, second trimester: Secondary | ICD-10-CM

## 2020-01-12 DIAGNOSIS — O361120 Maternal care for Anti-A sensitization, second trimester, not applicable or unspecified: Secondary | ICD-10-CM

## 2020-01-12 DIAGNOSIS — D573 Sickle-cell trait: Secondary | ICD-10-CM

## 2020-01-12 DIAGNOSIS — Z3A19 19 weeks gestation of pregnancy: Secondary | ICD-10-CM

## 2020-01-12 DIAGNOSIS — O99012 Anemia complicating pregnancy, second trimester: Secondary | ICD-10-CM

## 2020-01-12 DIAGNOSIS — Z363 Encounter for antenatal screening for malformations: Secondary | ICD-10-CM

## 2020-01-12 DIAGNOSIS — O358XX0 Maternal care for other (suspected) fetal abnormality and damage, not applicable or unspecified: Secondary | ICD-10-CM

## 2020-01-12 DIAGNOSIS — O359XX0 Maternal care for (suspected) fetal abnormality and damage, unspecified, not applicable or unspecified: Secondary | ICD-10-CM

## 2020-01-12 DIAGNOSIS — Z148 Genetic carrier of other disease: Secondary | ICD-10-CM

## 2020-01-12 DIAGNOSIS — Z315 Encounter for genetic counseling: Secondary | ICD-10-CM

## 2020-01-12 DIAGNOSIS — Z3A21 21 weeks gestation of pregnancy: Secondary | ICD-10-CM

## 2020-01-12 NOTE — Progress Notes (Unsigned)
MFM Consult Note  This patient was seen for a detailed fetal anatomy scan due to advanced maternal age (39 years old) and chronic hepatitis B.  The patient also recently screened positive for the anti-E antibody.  This is her sixth pregnancy.  She has delivered 5 prior children without any issues.  She denies receiving a blood transfusion in the past.  She denies any other significant past medical history and denies any problems in her current pregnancy.    The patient has not had any screening tests for fetal aneuploidy drawn in her current pregnancy.  She was informed that the fetal growth and amniotic fluid level were appropriate for her gestational age.   On today's exam, an intracardiac echogenic focus was noted in the left ventricle of the fetal heart.  The small association between an echogenic focus and Down syndrome was discussed. Due to the echogenic focus noted today, the patient was offered and declined an amniocentesis today for definitive diagnosis of fetal aneuploidy.  The patient stated that an aneuploid fetus would not change the management or outcome of her current pregnancy.  The patient was informed that anomalies may be missed due to technical limitations. If the fetus is in a suboptimal position or maternal habitus is increased, visualization of the fetus in the maternal uterus may be impaired.  The implications and management of isoimmunization to the E antigen was discussed with the patient today.  As she has never received a blood transfusion in the past, she was advised that she may have been exposed to the E antigen during one of her prior pregnancies.  She reports that this is the first time that she has screened positive for the anti-E antibody.  Her husband has not been screened to determine if he carries the E antigen.  She was advised that should the fetus that she is carrying have the E antigen, that the anti-E antibodies in her blood may cross over the placenta and  cause fetal anemia.    Her husband may be screened to determine if he carries the E antigen.  Should he be negative for the E antigen and it is certain that he is the father of the baby, then her baby would not be at risk for anemia.  She was reassured that as the anti-E antibody titer level is low (1:2), her baby is most likely not anemic at this time.  She should continue to be followed with monthly anti-E antibody titer levels.  Should the anti-E antibody titer levels reach a critical titer level of 1:16 or greater or should there be a progressive increase in her anti-E antibody titer levels during her pregnancy, she should be referred back to our office for measurement of the peak systolic velocity of the middle cerebral artery for detection of fetal anemia via ultrasound.  The patient understands that should fetal anemia be suspected during her prenatal ultrasounds, that she may require in intra uterine fetal blood transfusion or delivery depending on her gestational age.  We will continue to follow her closely with serial ultrasound exams throughout her pregnancy.  Please forward her monthly anti-E antibody titer levels to our office.    The pediatricians should be notified at the time of delivery regarding her chronic hepatitis B status.  Her baby should receive the first dose of the hepatitis B vaccine and the hepatitis B immunoglobulin after delivery.  Due to advanced maternal age (greater than 70 years old), weekly fetal testing should be started at  around 36 weeks.   At the end of the consultation, the patient stated that all her questions have been answered to her complete satisfaction.  A total of 45 minutes was spent counseling and coordinating the care for this patient.    This note was copied from an original document from the [facility].   Recommendations: Monthly anti-E antibody titer levels Her husband may be screened to determine if he carries the E antigen MCA Doppler  studies to screen for fetal anemia should her anti-E antibody titer levels reach a critical level (1:16 or greater) Monthly growth ultrasounds in our office Her baby should receive the hepatitis B vaccine and the hepatitis B immunoglobulin after delivery Weekly fetal testing starting at 36 weeks

## 2020-01-12 NOTE — Progress Notes (Signed)
01/12/2020  Angela Boyle Mar 09, 1981 MRN: [id number] DOV: 01/12/2020  Angela Boyle presented to the [facility] for a genetics consultation regarding advanced maternal age and her carrier status for sickle cell disease. Angela Boyle presented to her appointment alone.   Indication for genetic counseling - Advanced maternal age - Sickle cell trait  Prenatal history  Angela Boyle is a [id number], 39 y.o. female. Her current pregnancy has completed [redacted]w[redacted]d (Estimated Date of Delivery: 05/24/20). Angela Boyle and her partner have an 34 year old son, a 73 year old son, a 43 year old daughter, a 16 year old son, and a 67 year old daughter.  Angela Boyle denied exposure to environmental toxins or chemical agents. She denied the use of alcohol, tobacco or street drugs. She reported taking prenatal vitamins. She denied significant viral illnesses, fevers, and bleeding during the course of her pregnancy. She has a history of chronic hepatitis B. Her medical and surgical histories were otherwise noncontributory.  Family History  A three generation pedigree was drafted and reviewed. Both family histories were reviewed and found to be noncontributory for birth defects, intellectual disability, recurrent pregnancy loss, and known genetic conditions.    The patient's ethnicity is [place]ien. The father of the pregnancy's ethnicity is [place]ien. Ashkenazi Jewish ancestry and consanguinity were denied. Pedigree will be scanned under Media.  Discussion  Advanced maternal age:  Angela Boyle was referred to genetic counseling for advanced maternal age, as she will be 39 years old at the time of delivery. We discussed that as a woman ages, the risk for certain chromosomal conditions, such as trisomy 71 (Down syndrome), trisomy 108, and trisomy 18 increases. These conditions often are not inherited, but instead occur due to an error in chromosomal division during the formation of sperm and  egg cells in a process called nondisjunction. At her age and during the second trimester, Angela Boyle has approximately a 1 in 27 (2.9%) chance of having a child with a chromosomal abnormality. Her age-related risk to have a child with Down syndrome specifically is 1 in 54 (1.7%) in the second trimester. We briefly reviewed features associated with Down syndrome, trisomy 82, and trisomy 35.    We reviewed noninvasive prenatal screening (NIPS) as an available screening option for chromosomal aneuploidies. Specifically, we discussed that NIPS analyzes cell free DNA originating from the placenta that is found in the maternal blood circulation during pregnancy. This test is not diagnostic for chromosome conditions, but can provide information regarding the presence or absence of extra fetal DNA for chromosomes 13, 18 21, and the sex chromosomes. Thus, it would not identify or rule out all fetal aneuploidy. The reported detection rate is 91-99% for trisomies 21, 18, 13, and sex chromosome aneuploidies. The false positive rate is reported to be less than 0.1% for any of these conditions. Angela Boyle indicated that she is not interested in pursuing NIPS.    Advanced paternal age:  Given that Angela Boyle's partner will be 31 years old at the time of delivery, we also reviewed considerations associated with advanced paternal age. Advanced paternal age (APA) is defined as greater than 19 years old at time of conception. The chance of de novo (new) single gene mutations in an individual increases with paternal age. We discussed an available screening option for conditions associated with advanced paternal age called Vistara. Vistara utilizes noninvasive prenatal screening (NIPS) to screen a pregnancy for 25 autosomal or X-linked dominant single gene disorders. These include conditions  associated with advanced paternal age, such as Noonan syndrome, skeletal dysplasias, and craniosynostoses. Vistara is NOT diagnostic,  and a positive result requires confirmation by CVS or amniocentesis. A negative test result does not rule out all single gene disorders. Angela Boyle was not interested in pursuing Vistara.  Ultrasound:  A complete ultrasound was performed today prior to our visit. The ultrasound report will be sent under separate cover. An echogenic intracardiac focus (EIF) was identified on ultrasound. Angela Boyle was counseled that EIF is present in approximately 3-5% of chromosomally normal fetuses, but up to 30% of fetuses with Down syndrome. The likelihood ratio for fetal Down syndrome associated with EIF is 5.83 (Agathokleous et al., 2013). Given the identified soft marker, the adjusted risk for fetal Down syndrome in the current pregnancy is recalculated to be 9.9%. There were no other visualized fetal anomalies or markers suggestive of aneuploidy on today's ultrasound. We discussed that fetuses with some of the single gene disorders associated with APA may demonstrate signs of the condition on ultrasound. However, ultrasound cannot identify all fetuses with these conditions. Angela Boyle understands this limitation.  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 We discussed that SCD is one condition in a group of blood disorders that affect hemoglobin in red blood cells (hemoglobinopathies). Hemoglobin is a protein that transports oxygen from the lungs to organs and tissues throughout the body. Individuals with SCD have an inherited structural abnormality in hemoglobin's beta globin chains due to a single amino acid change in the HBB gene. Instead of producing normal adult hemoglobin (Hgb A), individuals with SCD produce an atypical form of hemoglobin called hemoglobin S (Hgb S). Typically, individuals are expected to have  two copies of Hgb A (Hgb AA). Individuals who are carriers of SCD have one copy of Hgb A and one copy of Hgb S (Hgb AS), whereas individuals affected by SCD have two copies of Hgb S (Hgb SS). Carriers of SCD are often said to have sickle cell "trait". Angela Boyle inherited sickle cell trait from her father. Two of her daughters are also carriers of sickle cell trait.  If Angela Boyle's partner carries sickle cell trait or another abnormal form of hemoglobin, such as hemoglobin C trait, the couple would have a 1 in 4 (25%) chance of having a child with SCD or hemoglobin [place] disease. If he is a carrier for beta-thalassemia due to a different variant in the HBB gene,  the couple would have a 1 in 4 (25%) chance of having a child with sickle beta-thalassemia. Ms. Mungin informed me that her partner has already undergone testing which was negative. However, a copy of his testing report was not available for my review today. Angela Boyle agreed to email me a copy of his testing report if she can find it for a more accurate risk assessment.  Other carrier screening:  Per ACOG recommendation, carrier screening for cystic fibrosis (CF) and spinal muscular atrophy (SMA) was discussed including information about the conditions, rationale for testing, autosomal recessive inheritance, and the option of prenatal diagnosis. I offered carrier screening for these conditions, which Angela Boyle declined at this time. Without carrier screening to refine risk and based on ethnicity alone, Angela Boyle chance of being a carrier for CF is 1 in 67. Her chance of being  a carrier for SMA is 1 in 81. Ms. Hirota was informed that select hemoglobinopathies, CF, and SMA are included on [place]'s newborn screen.   Diagnostic testing:  Ms. Prescher was also counseled regarding diagnostic testing via amniocentesis. We discussed the technical aspects of the procedure and quoted up to a 1 in 500 (0.2%) risk for spontaneous  pregnancy loss or other adverse pregnancy outcomes as a result of amniocentesis. Cultured cells from an amniocentesis sample allow for the visualization of a fetal karyotype, which can detect >99% of chromosomal aberrations. Chromosomal microarray can also be performed to identify smaller deletions or duplications of fetal chromosomal material. After careful consideration, Ms. Dines declined amniocentesis at this time. She understands that amniocentesis is available at any point after 16 weeks of pregnancy and that she may opt to undergo the procedure at a later date should she change her mind.  Plan:  Ms. Cleckley declined NIPS, Senaida Ores, carrier screening for CF and SMA, and amniocentesis today. She informed me that if she were to get a positive result on any form of screening or diagnostic testing, it would not change how she managed the pregnancy. For this reason, she prefersF to continue following the pregnancy with standard ultrasound. Ms. Roeder understands that screening tests, including ultrasound, cannot rule out all birth defects or genetic syndromes. The patient was advised of this limitation and states she still does not want additional testing or screening at this time.   I counseled Ms. Baig regarding the above risks and available options. The approximate face-to-face time with the genetic counselor was 30 minutes.  In summary:  Discussed advanced maternal age  1.7-2.9% chance for chromosomal aneuploidy based on age at delivery  Reviewed results of ultrasound  Echogenic intracardiac focus seen  Increases chance of current fetus having Down syndrome to 9.9%  Discussed advanced paternal age  Increased chance for de novo single gene conditions, autism, and other conditions  Discussed carrier screening for hemoglobinopathies, cystic fibrosis, & spinal muscular atrophy  Known carrier of sickle cell trait. Husband reportedly is not a carrier (testing report not available  for my review)  Declined additional carrier screening for CF & SMA  Offered additional testing and screening  Declined NIPS & Vistara single gene NIPS  Declined amniocentesis  Reviewed family history concerns   Buelah Manis, MS, Counselling psychologist

## 2020-01-17 ENCOUNTER — Encounter: Payer: Self-pay | Admitting: *Deleted

## 2020-01-19 ENCOUNTER — Other Ambulatory Visit: Payer: Self-pay

## 2020-01-19 ENCOUNTER — Ambulatory Visit (INDEPENDENT_AMBULATORY_CARE_PROVIDER_SITE_OTHER): Payer: Self-pay | Admitting: Family Medicine

## 2020-01-19 VITALS — BP 99/62 | HR 97 | Wt 178.6 lb

## 2020-01-19 DIAGNOSIS — O09522 Supervision of elderly multigravida, second trimester: Secondary | ICD-10-CM

## 2020-01-19 DIAGNOSIS — B181 Chronic viral hepatitis B without delta-agent: Secondary | ICD-10-CM

## 2020-01-19 DIAGNOSIS — Z641 Problems related to multiparity: Secondary | ICD-10-CM

## 2020-01-19 DIAGNOSIS — O0990 Supervision of high risk pregnancy, unspecified, unspecified trimester: Secondary | ICD-10-CM | POA: Insufficient documentation

## 2020-01-19 DIAGNOSIS — O98419 Viral hepatitis complicating pregnancy, unspecified trimester: Secondary | ICD-10-CM

## 2020-01-19 DIAGNOSIS — D573 Sickle-cell trait: Secondary | ICD-10-CM

## 2020-01-19 DIAGNOSIS — N6320 Unspecified lump in the left breast, unspecified quadrant: Secondary | ICD-10-CM

## 2020-01-19 DIAGNOSIS — O09529 Supervision of elderly multigravida, unspecified trimester: Secondary | ICD-10-CM

## 2020-01-19 DIAGNOSIS — O0942 Supervision of pregnancy with grand multiparity, second trimester: Secondary | ICD-10-CM

## 2020-01-19 DIAGNOSIS — O0992 Supervision of high risk pregnancy, unspecified, second trimester: Secondary | ICD-10-CM

## 2020-01-19 DIAGNOSIS — O99012 Anemia complicating pregnancy, second trimester: Secondary | ICD-10-CM

## 2020-01-19 DIAGNOSIS — Z3A22 22 weeks gestation of pregnancy: Secondary | ICD-10-CM

## 2020-01-19 DIAGNOSIS — O98412 Viral hepatitis complicating pregnancy, second trimester: Secondary | ICD-10-CM

## 2020-01-19 HISTORY — DX: Chronic viral hepatitis B without delta-agent: B18.1

## 2020-01-19 HISTORY — DX: Chronic viral hepatitis B without delta-agent: O98.419

## 2020-01-19 NOTE — Progress Notes (Signed)
   PRENATAL VISIT NOTE  Subjective:  Angela Boyle is a 39 y.o. G6P5005 at 10w0dbeing seen today for ongoing prenatal care.  She is currently monitored for the following issues for this high-risk pregnancy and has Breast lump on left side at 10 o'clock position; Breast pain in female; Pregnancy; Term pregnancy delivered; Sickle cell trait ([place]; Supervision of high risk pregnancy, antepartum; and Chronic hepatitis B affecting antepartum care of mother ([facility] on their problem list.   [facility]   Patient reports no complaints.  Contractions: Not present. Vag. Bleeding: None.  Movement: Present. Denies leaking of fluid.   The following portions of the patient's history were reviewed and updated as appropriate: allergies, current medications, past family history, past medical history, past social history, past surgical history and problem list.   Objective:   Vitals:   01/19/20 1056  BP: (!) 99/62  Pulse: 97  Weight: 178 lb 9.6 oz (81 kg)    Fetal Status: Fetal Heart Rate (bpm): 149   Movement: Present     General:  Alert, oriented and cooperative. Patient is in no acute distress.  Skin: Skin is warm and dry. No rash noted.   Cardiovascular: Normal heart rate noted  Respiratory: Normal respiratory effort, no problems with respiration noted  Abdomen: Soft, gravid, appropriate for gestational age.  Pain/Pressure: Absent     Pelvic: Cervical exam deferred        Extremities: Normal range of motion.  Edema: None  Mental Status: Normal mood and affect. Normal behavior. Normal judgment and thought content.   Assessment and Plan:  Pregnancy: [id number] was seen today for initial prenatal visit.  Diagnoses and all orders for this visit:  Supervision of high risk pregnancy, antepartum       - Declines genetic screening; already met with genetic counseling       - Transfer from HD today for Hep B; RTC in 4 weeks    [facility]     Comprehensive metabolic panel -     Hepatitis B DNA, ultraquantitative, PCR -     Hepatitis B E Antibody -     Hepatitis B E Antigen - Per HD records: Titer 1:2 - Needs monthly titers and if level 1:16 or greater or if there is a progressive increase in anti-E Ab; needs referred back to MFM  - Baby should receive first dose of HBV and HB immunoglobulin after delivery  - Recommended husband be screened  - Reports she was first told she had Hep B with her first pregnancy; no hx blood transfusion  Antepartum multigravida of advanced maternal age [facility]       - Denies hx PPH       - Antenatal testing starting at 36w  Preterm labor symptoms and general obstetric precautions including but not limited to vaginal bleeding, contractions, leaking of fluid and fetal movement were reviewed in detail with the patient. Please refer to After Visit Summary for other counseling recommendations.   [facility] in about 4 weeks (around 02/16/2020) [facility]  Future Appointments  Date Time Provider [place]8/18/2021  9:30 AM [facility]8/[facility]/2021  9:45 AM [facility] US4 [facility]  ChChauncey MannMD

## 2020-01-20 LAB — COMPREHENSIVE METABOLIC PANEL
ALT: 17 IU/L (ref 0–32)
AST: 14 IU/L (ref 0–40)
Albumin/Globulin Ratio: 1.6 (ref 1.2–2.2)
Albumin: 3.8 g/dL (ref 3.8–4.8)
Alkaline Phosphatase: 44 IU/L — ABNORMAL LOW (ref 48–121)
BUN/Creatinine Ratio: 11 (ref 9–23)
BUN: 5 mg/dL — ABNORMAL LOW (ref 6–24)
Bilirubin Total: 0.3 mg/dL (ref 0.0–1.2)
CO2: 22 mmol/L (ref 20–29)
Calcium: 9.1 mg/dL (ref 8.7–10.2)
Chloride: 101 mmol/L (ref 96–106)
Creatinine, Ser: 0.47 mg/dL — ABNORMAL LOW (ref 0.57–1.00)
GFR calc Af Amer: 142 mL/min/{1.73_m2} (ref >59)
GFR calc non Af Amer: 123 mL/min/{1.73_m2} (ref >59)
Globulin, Total: 2.4 g/dL (ref 1.5–4.5)
Glucose: 63 mg/dL — ABNORMAL LOW (ref 65–99)
Potassium: 3.9 mmol/L (ref 3.5–5.2)
Sodium: 135 mmol/L (ref 134–144)
Total Protein: 6.2 g/dL (ref 6.0–8.5)

## 2020-01-20 LAB — HEPATITIS B E ANTIGEN: Hep B E Ag: NEGATIVE

## 2020-01-20 LAB — HEPATITIS B DNA, ULTRAQUANTITATIVE, PCR: HBV DNA SERPL PCR-ACNC: <10 IU/mL

## 2020-01-20 LAB — HEPATITIS B E ANTIBODY: Hep B E Ab: POSITIVE — AB

## 2020-01-25 DIAGNOSIS — O0990 Supervision of high risk pregnancy, unspecified, unspecified trimester: Secondary | ICD-10-CM

## 2020-02-09 ENCOUNTER — Other Ambulatory Visit: Payer: Self-pay | Admitting: *Deleted

## 2020-02-09 ENCOUNTER — Other Ambulatory Visit: Payer: Self-pay

## 2020-02-09 ENCOUNTER — Other Ambulatory Visit: Payer: Self-pay | Admitting: Obstetrics

## 2020-02-09 ENCOUNTER — Ambulatory Visit: Payer: Self-pay | Admitting: *Deleted

## 2020-02-09 ENCOUNTER — Ambulatory Visit: Payer: Self-pay | Attending: Obstetrics and Gynecology

## 2020-02-09 DIAGNOSIS — O360920 Maternal care for other rhesus isoimmunization, second trimester, not applicable or unspecified: Secondary | ICD-10-CM

## 2020-02-09 DIAGNOSIS — O0990 Supervision of high risk pregnancy, unspecified, unspecified trimester: Secondary | ICD-10-CM | POA: Insufficient documentation

## 2020-02-09 DIAGNOSIS — O09529 Supervision of elderly multigravida, unspecified trimester: Secondary | ICD-10-CM

## 2020-02-09 DIAGNOSIS — Z862 Personal history of diseases of the blood and blood-forming organs and certain disorders involving the immune mechanism: Secondary | ICD-10-CM

## 2020-02-09 DIAGNOSIS — Z362 Encounter for other antenatal screening follow-up: Secondary | ICD-10-CM | POA: Insufficient documentation

## 2020-02-09 DIAGNOSIS — B1910 Unspecified viral hepatitis B without hepatic coma: Secondary | ICD-10-CM

## 2020-02-09 DIAGNOSIS — O09522 Supervision of elderly multigravida, second trimester: Secondary | ICD-10-CM

## 2020-02-09 DIAGNOSIS — Z3A25 25 weeks gestation of pregnancy: Secondary | ICD-10-CM

## 2020-02-09 DIAGNOSIS — O98412 Viral hepatitis complicating pregnancy, second trimester: Secondary | ICD-10-CM

## 2020-02-16 ENCOUNTER — Telehealth: Payer: Self-pay | Admitting: Family Medicine

## 2020-02-16 NOTE — Telephone Encounter (Signed)
Patient called to say because of her children being in school, she is not able to come to her appointment. She can only come around 3:30 in the afternoon.

## 2020-03-09 ENCOUNTER — Ambulatory Visit (INDEPENDENT_AMBULATORY_CARE_PROVIDER_SITE_OTHER): Payer: Self-pay | Admitting: Obstetrics and Gynecology

## 2020-03-09 ENCOUNTER — Encounter: Payer: Self-pay | Admitting: Obstetrics and Gynecology

## 2020-03-09 ENCOUNTER — Other Ambulatory Visit: Payer: Self-pay

## 2020-03-09 VITALS — BP 108/73 | HR 88 | Wt 182.2 lb

## 2020-03-09 DIAGNOSIS — O0990 Supervision of high risk pregnancy, unspecified, unspecified trimester: Secondary | ICD-10-CM

## 2020-03-09 DIAGNOSIS — O98419 Viral hepatitis complicating pregnancy, unspecified trimester: Secondary | ICD-10-CM

## 2020-03-09 DIAGNOSIS — B181 Chronic viral hepatitis B without delta-agent: Secondary | ICD-10-CM

## 2020-03-09 DIAGNOSIS — Z3A29 29 weeks gestation of pregnancy: Secondary | ICD-10-CM | POA: Insufficient documentation

## 2020-03-09 DIAGNOSIS — Z23 Encounter for immunization: Secondary | ICD-10-CM

## 2020-03-09 DIAGNOSIS — D573 Sickle-cell trait: Secondary | ICD-10-CM

## 2020-03-09 NOTE — Progress Notes (Signed)
   PRENATAL VISIT NOTE  Subjective:  Angela Boyle is a 39 y.o. G6P5005 at [redacted]w[redacted]d being seen today for ongoing prenatal care.  She is currently monitored for the following issues for this high-risk pregnancy and has Breast lump on left side at 10 o'clock position; Breast pain in female; Pregnancy; Term pregnancy delivered; Sickle cell trait ([name]); Supervision of high risk pregnancy, antepartum; Chronic hepatitis B affecting antepartum care of mother [facility]); and [redacted] weeks gestation of pregnancy on their problem list.  Patient doing well with no acute concerns today. She reports no complaints.  Contractions: Irritability. Vag. Bleeding: None.  Movement: Present. Denies leaking of fluid.   The following portions of the patient's history were reviewed and updated as appropriate: allergies, current medications, past family history, past medical history, past social history, past surgical history and problem list. Problem list updated.  Objective:   Vitals:   03/09/20 1635  BP: 108/73  Pulse: 88  Weight: 182 lb 3.2 oz (82.6 kg)    Fetal Status: Fetal Heart Rate (bpm): 154   Movement: Present     General:  Alert, oriented and cooperative. Patient is in no acute distress.  Skin: Skin is warm and dry. No rash noted.   Cardiovascular: Normal heart rate noted  Respiratory: Normal respiratory effort, no problems with respiration noted  Abdomen: Soft, gravid, appropriate for gestational age.  Pain/Pressure: Absent     Pelvic: Cervical exam deferred        Extremities: Normal range of motion.  Edema: None  Mental Status:  Normal mood and affect. Normal behavior. Normal judgment and thought content.   Assessment and Plan:  Pregnancy: [id number] at [redacted]w[redacted]d  1. Supervision of high risk pregnancy, antepartum  - Tdap vaccine greater than or equal to 7yo IM  2. Chronic hepatitis B affecting antepartum care of mother [facility]) Rechecking titer per previous note's instructions - Hepatitis B E Antigen  3.  Sickle cell trait (HCC)   4. [redacted] weeks gestation of pregnancy   Preterm labor symptoms and general obstetric precautions including but not limited to vaginal bleeding, contractions, leaking of fluid and fetal movement were reviewed in detail with the patient.  Please refer to After Visit Summary for other counseling recommendations.   Return in about 2 weeks (around 03/23/2020) for [facility], in person.   Lynnda Shields, MD

## 2020-03-09 NOTE — Patient Instructions (Signed)

## 2020-03-10 LAB — HEPATITIS B E ANTIGEN: Hep B E Ag: NEGATIVE

## 2020-03-22 ENCOUNTER — Other Ambulatory Visit: Payer: Self-pay

## 2020-03-22 ENCOUNTER — Ambulatory Visit (INDEPENDENT_AMBULATORY_CARE_PROVIDER_SITE_OTHER): Payer: Self-pay | Admitting: Obstetrics & Gynecology

## 2020-03-22 VITALS — BP 115/74 | HR 64 | Wt 184.0 lb

## 2020-03-22 DIAGNOSIS — O0990 Supervision of high risk pregnancy, unspecified, unspecified trimester: Secondary | ICD-10-CM

## 2020-03-22 DIAGNOSIS — O98419 Viral hepatitis complicating pregnancy, unspecified trimester: Secondary | ICD-10-CM

## 2020-03-22 DIAGNOSIS — B181 Chronic viral hepatitis B without delta-agent: Secondary | ICD-10-CM

## 2020-03-22 NOTE — Patient Instructions (Signed)
Gestational Diabetes Mellitus, Diagnosis Gestational diabetes (gestational diabetes mellitus) is a short-term (temporary) form of diabetes that can happen during pregnancy. It goes away after you give birth. It may be caused by one or both of these problems:  Your pancreas does not make enough of a hormone called insulin.  Your body does not respond in a normal way to insulin that it makes. Insulin lets sugars (glucose) go into cells in the body. This gives you energy. If you have diabetes, sugars cannot get into cells. This causes high blood sugar (hyperglycemia). If you get gestational diabetes, you are:  More likely to get it if you get pregnant again.  More likely to develop type 2 diabetes in the future. If gestational diabetes is treated, it may not hurt you or your baby. Your doctor will set treatment goals for you. In general, you should have these blood sugar levels:  After not eating for a long time (fasting): 95 mg/dL (5.3 mmol/L).  After meals (postprandial): ? One hour after a meal: at or below 140 mg/dL (7.8 mmol/L). ? Two hours after a meal: at or below 120 mg/dL (6.7 mmol/L).  A1c (hemoglobin A1c) level: 6-6.5%. Follow these instructions at home: Questions to ask your doctor   You may want to ask these questions: ? Do I need to meet with a diabetes educator? ? What equipment will I need to care for myself at home? ? What medicines do I need? When should I take them? ? How often do I need to check my blood sugar? ? What number can I call if I have questions? ? When is my next doctor's visit? General instructions  Take over-the-counter and prescription medicines only as told by your doctor.  Stay at a healthy weight during pregnancy.  Keep all follow-up visits as told by your doctor. This is important. Contact a doctor if:  Your blood sugar is at or above 240 mg/dL (13.3 mmol/L).  Your blood sugar is at or above 200 mg/dL (11.1 mmol/L) and you have ketones in  your pee (urine).  You have been sick or have had a fever for 2 days or more and you are not getting better.  You have any of these problems for more than 6 hours: ? You cannot eat or drink. ? You feel sick to your stomach (nauseous). ? You throw up (vomit). ? You have watery poop (diarrhea). Get help right away if:  Your blood sugar is lower than 54 mg/dL (3 mmol/L).  You get confused.  You have trouble: ? Thinking clearly. ? Breathing.  Your baby moves less than normal.  You have any of these: ? Moderate or large ketone levels in your pee. ? Blood coming from your vagina. ? Unusual fluid coming from your vagina. ? Early contractions. These may feel like tightness in your belly. Summary  Gestational diabetes is a short-term form of diabetes. It can happen while you are pregnant. It goes away after you give birth.  If gestational diabetes is treated, it may not hurt you or your baby. Your doctor will set treatment goals for you.  Keep all follow-up visits as told by your doctor. This is important. This information is not intended to replace advice given to you by your health care provider. Make sure you discuss any questions you have with your health care provider. Document Revised: 07/17/2017 Document Reviewed: 07/14/2015 Elsevier Patient Education  2020 [employer].

## 2020-03-29 ENCOUNTER — Ambulatory Visit: Payer: Self-pay | Admitting: *Deleted

## 2020-03-29 ENCOUNTER — Other Ambulatory Visit: Payer: Self-pay

## 2020-03-29 ENCOUNTER — Other Ambulatory Visit: Payer: Self-pay | Admitting: *Deleted

## 2020-03-29 ENCOUNTER — Ambulatory Visit: Payer: Self-pay | Attending: Obstetrics and Gynecology

## 2020-03-29 DIAGNOSIS — Z362 Encounter for other antenatal screening follow-up: Secondary | ICD-10-CM

## 2020-03-29 DIAGNOSIS — O0990 Supervision of high risk pregnancy, unspecified, unspecified trimester: Secondary | ICD-10-CM

## 2020-03-29 DIAGNOSIS — O09529 Supervision of elderly multigravida, unspecified trimester: Secondary | ICD-10-CM

## 2020-03-29 DIAGNOSIS — O98412 Viral hepatitis complicating pregnancy, second trimester: Secondary | ICD-10-CM

## 2020-03-29 DIAGNOSIS — D573 Sickle-cell trait: Secondary | ICD-10-CM

## 2020-03-29 DIAGNOSIS — O09522 Supervision of elderly multigravida, second trimester: Secondary | ICD-10-CM

## 2020-03-29 DIAGNOSIS — O360920 Maternal care for other rhesus isoimmunization, second trimester, not applicable or unspecified: Secondary | ICD-10-CM

## 2020-03-29 DIAGNOSIS — Z3A32 32 weeks gestation of pregnancy: Secondary | ICD-10-CM

## 2020-03-29 DIAGNOSIS — B1910 Unspecified viral hepatitis B without hepatic coma: Secondary | ICD-10-CM

## 2020-03-29 DIAGNOSIS — O99013 Anemia complicating pregnancy, third trimester: Secondary | ICD-10-CM

## 2020-03-31 ENCOUNTER — Other Ambulatory Visit: Payer: Self-pay | Admitting: General Practice

## 2020-03-31 DIAGNOSIS — O0990 Supervision of high risk pregnancy, unspecified, unspecified trimester: Secondary | ICD-10-CM

## 2020-04-03 ENCOUNTER — Encounter: Payer: Self-pay | Admitting: Obstetrics & Gynecology

## 2020-04-03 ENCOUNTER — Other Ambulatory Visit: Payer: Self-pay

## 2020-04-03 ENCOUNTER — Ambulatory Visit (INDEPENDENT_AMBULATORY_CARE_PROVIDER_SITE_OTHER): Payer: Self-pay | Admitting: Obstetrics & Gynecology

## 2020-04-03 VITALS — BP 103/64 | HR 92 | Wt 185.7 lb

## 2020-04-03 DIAGNOSIS — O0990 Supervision of high risk pregnancy, unspecified, unspecified trimester: Secondary | ICD-10-CM

## 2020-04-03 DIAGNOSIS — O98419 Viral hepatitis complicating pregnancy, unspecified trimester: Secondary | ICD-10-CM

## 2020-04-03 DIAGNOSIS — B181 Chronic viral hepatitis B without delta-agent: Secondary | ICD-10-CM

## 2020-04-03 NOTE — Patient Instructions (Signed)

## 2020-04-03 NOTE — Progress Notes (Signed)
   PRENATAL VISIT NOTE  Subjective:  Angela Boyle is a 39 y.o. G6P5005 at 110w5d being seen today for ongoing prenatal care.  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  Patient reports no complaints.  Contractions: Not present. Vag. Bleeding: None.  Movement: Present. Denies leaking of fluid.   The following portions of the patient's history were reviewed and updated as appropriate: allergies, current medications, past family history, past medical history, past social history, past surgical history and problem list.   Objective:   Vitals:   04/03/20 0940  BP: 103/64  Pulse: 92  Weight: 185 lb 11.2 oz (84.2 kg)    Fetal Status: Fetal Heart Rate (bpm): 149 Fundal Height: 34 cm Movement: Present     General:  Alert, oriented and cooperative. Patient is in no acute distress.  Skin: Skin is warm and dry. No rash noted.   Cardiovascular: Normal heart rate noted  Respiratory: Normal respiratory effort, no problems with respiration noted  Abdomen: Soft, gravid, appropriate for gestational age.  Pain/Pressure: Absent     Pelvic: Cervical exam deferred        Extremities: Normal range of motion.  Edema: None  Mental Status: Normal mood and affect. Normal behavior. Normal judgment and thought content.   Assessment and Plan:  Pregnancy: [id number] at [redacted]w[redacted]d 1. Chronic hepatitis B affecting antepartum care of mother [facility]) Inactive infection  2. Supervision of high risk pregnancy, antepartum Growth is normal, has f/u 4 week  Preterm labor symptoms and general obstetric precautions including but not limited to vaginal bleeding, contractions, leaking of fluid and fetal movement were reviewed in detail with the patient. Please refer to After Visit Summary for other counseling  recommendations.   No follow-ups on file.  Future Appointments  Date Time Provider [place]  05/03/2020 11:00 AM [facility] NURSE [facility]  05/03/2020 11:15 AM [facility] US2 [facility]    Emeterio Reeve, MD

## 2020-04-04 LAB — HIV ANTIBODY (ROUTINE TESTING W REFLEX): HIV Screen 4th Generation wRfx: NONREACTIVE

## 2020-04-04 LAB — CBC
Hematocrit: 35.8 % (ref 34.0–46.6)
Hemoglobin: 12 g/dL (ref 11.1–15.9)
MCH: 26.5 pg — ABNORMAL LOW (ref 26.6–33.0)
MCHC: 33.5 g/dL (ref 31.5–35.7)
MCV: 79 fL (ref 79–97)
Platelets: 170 10*3/uL (ref 150–450)
RBC: 4.53 x10E6/uL (ref 3.77–5.28)
RDW: 14.9 % (ref 11.7–15.4)
WBC: 7.1 10*3/uL (ref 3.4–10.8)

## 2020-04-04 LAB — GLUCOSE TOLERANCE, 2 HOURS W/ 1HR
Glucose, 1 hour: 104 mg/dL (ref 65–179)
Glucose, 2 hour: 117 mg/dL (ref 65–152)
Glucose, Fasting: 71 mg/dL (ref 65–91)

## 2020-04-04 LAB — RPR: RPR Ser Ql: NONREACTIVE

## 2020-04-20 ENCOUNTER — Other Ambulatory Visit: Payer: Self-pay

## 2020-04-20 ENCOUNTER — Ambulatory Visit (INDEPENDENT_AMBULATORY_CARE_PROVIDER_SITE_OTHER): Payer: Self-pay | Admitting: Obstetrics & Gynecology

## 2020-04-20 VITALS — BP 113/66 | HR 89 | Wt 189.9 lb

## 2020-04-20 DIAGNOSIS — B181 Chronic viral hepatitis B without delta-agent: Secondary | ICD-10-CM

## 2020-04-20 DIAGNOSIS — O0990 Supervision of high risk pregnancy, unspecified, unspecified trimester: Secondary | ICD-10-CM

## 2020-04-20 DIAGNOSIS — O98419 Viral hepatitis complicating pregnancy, unspecified trimester: Secondary | ICD-10-CM

## 2020-04-20 DIAGNOSIS — O360930 Maternal care for other rhesus isoimmunization, third trimester, not applicable or unspecified: Secondary | ICD-10-CM | POA: Insufficient documentation

## 2020-04-20 NOTE — Progress Notes (Signed)
   PRENATAL VISIT NOTE  Subjective:  Angela Boyle is a 39 y.o. [id number] at [redacted]w[redacted]d being seen today for ongoing prenatal care.  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  Patient reports no complaints.  Contractions: Irregular. Vag. Bleeding: None.  Movement: Present. Denies leaking of fluid.   The following portions of the patient's history were reviewed and updated as appropriate: allergies, current medications, past family history, past medical history, past social history, past surgical history and problem list.   Objective:   Vitals:   04/20/20 0934  BP: 113/66  Pulse: 89  Weight: 189 lb 14.4 oz (86.1 kg)    Fetal Status: Fetal Heart Rate (bpm): 144 Fundal Height: 35 cm Movement: Present     General:  Alert, oriented and cooperative. Patient is in no acute distress.  Skin: Skin is warm and dry. No rash noted.   Cardiovascular: Normal heart rate noted  Respiratory: Normal respiratory effort, no problems with respiration noted  Abdomen: Soft, gravid, appropriate for gestational age.  Pain/Pressure: Absent     Pelvic: Cervical exam deferred        Extremities: Normal range of motion.  Edema: None  Mental Status: Normal mood and affect. Normal behavior. Normal judgment and thought content.   Assessment and Plan:  Pregnancy: [id number] at [redacted]w[redacted]d 1. Supervision of high risk pregnancy, antepartum GBS next visit  2. Chronic hepatitis B affecting antepartum care of mother [facility]) stable  3. 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 Please refer to After Visit Summary for other counseling recommendations.   Return in about 1 week (around 04/27/2020).  Future Appointments  Date Time Provider [place]  05/03/2020 11:00 AM [facility] NURSE [facility]  05/03/2020 11:15 AM [facility] US2 [facility]    Silas [place], MD

## 2020-04-27 ENCOUNTER — Encounter: Payer: Self-pay | Admitting: Obstetrics & Gynecology

## 2020-05-01 ENCOUNTER — Other Ambulatory Visit: Payer: Self-pay

## 2020-05-01 ENCOUNTER — Encounter: Payer: Self-pay | Admitting: Family Medicine

## 2020-05-01 ENCOUNTER — Ambulatory Visit (INDEPENDENT_AMBULATORY_CARE_PROVIDER_SITE_OTHER): Payer: Self-pay | Admitting: Family Medicine

## 2020-05-01 ENCOUNTER — Other Ambulatory Visit (HOSPITAL_COMMUNITY)
Admission: RE | Admit: 2020-05-01 | Discharge: 2020-05-01 | Disposition: A | Discharge status: Home / Self Care | Payer: Self-pay | Source: Ambulatory Visit | Attending: Obstetrics & Gynecology | Admitting: Obstetrics & Gynecology

## 2020-05-01 VITALS — BP 122/72 | HR 91 | Wt 191.0 lb

## 2020-05-01 DIAGNOSIS — O09529 Supervision of elderly multigravida, unspecified trimester: Secondary | ICD-10-CM | POA: Insufficient documentation

## 2020-05-01 DIAGNOSIS — O0990 Supervision of high risk pregnancy, unspecified, unspecified trimester: Secondary | ICD-10-CM | POA: Insufficient documentation

## 2020-05-01 DIAGNOSIS — O09523 Supervision of elderly multigravida, third trimester: Secondary | ICD-10-CM

## 2020-05-01 DIAGNOSIS — Z641 Problems related to multiparity: Secondary | ICD-10-CM

## 2020-05-01 DIAGNOSIS — O360930 Maternal care for other rhesus isoimmunization, third trimester, not applicable or unspecified: Secondary | ICD-10-CM

## 2020-05-01 DIAGNOSIS — B181 Chronic viral hepatitis B without delta-agent: Secondary | ICD-10-CM

## 2020-05-01 DIAGNOSIS — O98419 Viral hepatitis complicating pregnancy, unspecified trimester: Secondary | ICD-10-CM

## 2020-05-01 NOTE — Progress Notes (Signed)
ysh

## 2020-05-01 NOTE — Progress Notes (Signed)
   PRENATAL VISIT NOTE  Subjective:  Angela Boyle is a 39 y.o. [id number] at [redacted]w[redacted]d being seen today for ongoing prenatal care.  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  Patient reports no complaints.  Contractions: Irritability. Vag. Bleeding: None.  Movement: Present. Denies leaking of fluid.   The following portions of the patient's history were reviewed and updated as appropriate: allergies, current medications, past family history, past medical history, past social history, past surgical history and problem list.   Objective:   Vitals:   05/01/20 1353  BP: 122/72  Pulse: 91  Weight: 191 lb (86.6 kg)    Fetal Status: Fetal Heart Rate (bpm): 147   Movement: Present     General:  Alert, oriented and cooperative. Patient is in no acute distress.  Skin: Skin is warm and dry. No rash noted.   Cardiovascular: Normal heart rate noted  Respiratory: Normal respiratory effort, no problems with respiration noted  Abdomen: Soft, gravid, appropriate for gestational age.  Pain/Pressure: Absent     Pelvic: Cervical exam deferred        Extremities: Normal range of motion.  Edema: None  Mental Status: Normal mood and affect. Normal behavior. Normal judgment and thought content.   Assessment and Plan:  Pregnancy: [id number] at [redacted]w[redacted]d 1. Supervision of high risk pregnancy, antepartum Reviewed routine labs we would get today Discussed when to go to LD - Culture, beta strep (group b only) - GC/Chlamydia probe amp ([place])not at [facility]  2. 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 Chronic hepatitis B affecting antepartum care of mother [facility]) Knows about  immunization and IVIG for baby after delivery  4. AMA - will need IOL on EDD if not delivered prior, discuss at 38-39 wk visit - unable to do antenatal survellience due to financial reasons but does have [name] scheduled 11/10 - Encouraged kick counts  Preterm labor symptoms and general obstetric precautions including but not limited to vaginal bleeding, contractions, leaking of fluid and fetal movement were reviewed in detail with the patient. Please refer to After Visit Summary for other counseling recommendations.   No follow-ups on file.  Future Appointments  Date Time Provider [place]  05/03/2020 11:00 AM [facility] NURSE [facility]  05/03/2020 11:15 AM [facility] US2 [facility] [place]    Caren Macadam, MD

## 2020-05-02 LAB — GC/CHLAMYDIA PROBE AMP (~~LOC~~) NOT AT ARMC
Chlamydia: NEGATIVE
Comment: NEGATIVE
Comment: NORMAL
Neisseria Gonorrhea: NEGATIVE

## 2020-05-03 ENCOUNTER — Ambulatory Visit: Payer: Self-pay | Admitting: *Deleted

## 2020-05-03 ENCOUNTER — Other Ambulatory Visit: Payer: Self-pay | Admitting: *Deleted

## 2020-05-03 ENCOUNTER — Ambulatory Visit: Payer: Self-pay | Attending: Obstetrics and Gynecology

## 2020-05-03 ENCOUNTER — Other Ambulatory Visit: Payer: Self-pay

## 2020-05-03 ENCOUNTER — Encounter: Payer: Self-pay | Admitting: *Deleted

## 2020-05-03 DIAGNOSIS — Z641 Problems related to multiparity: Secondary | ICD-10-CM | POA: Insufficient documentation

## 2020-05-03 DIAGNOSIS — O98413 Viral hepatitis complicating pregnancy, third trimester: Secondary | ICD-10-CM

## 2020-05-03 DIAGNOSIS — O0990 Supervision of high risk pregnancy, unspecified, unspecified trimester: Secondary | ICD-10-CM

## 2020-05-03 DIAGNOSIS — O360930 Maternal care for other rhesus isoimmunization, third trimester, not applicable or unspecified: Secondary | ICD-10-CM

## 2020-05-03 DIAGNOSIS — O09523 Supervision of elderly multigravida, third trimester: Secondary | ICD-10-CM

## 2020-05-03 DIAGNOSIS — Z362 Encounter for other antenatal screening follow-up: Secondary | ICD-10-CM

## 2020-05-03 DIAGNOSIS — O09529 Supervision of elderly multigravida, unspecified trimester: Secondary | ICD-10-CM | POA: Insufficient documentation

## 2020-05-03 DIAGNOSIS — B1910 Unspecified viral hepatitis B without hepatic coma: Secondary | ICD-10-CM

## 2020-05-03 DIAGNOSIS — Z862 Personal history of diseases of the blood and blood-forming organs and certain disorders involving the immune mechanism: Secondary | ICD-10-CM

## 2020-05-03 DIAGNOSIS — Z3A37 37 weeks gestation of pregnancy: Secondary | ICD-10-CM

## 2020-05-03 DIAGNOSIS — B181 Chronic viral hepatitis B without delta-agent: Secondary | ICD-10-CM

## 2020-05-03 NOTE — Progress Notes (Signed)
C/o"tightening"

## 2020-05-05 LAB — CULTURE, BETA STREP (GROUP B ONLY): Strep Gp B Culture: NEGATIVE

## 2020-05-08 ENCOUNTER — Other Ambulatory Visit: Payer: Self-pay

## 2020-05-08 ENCOUNTER — Ambulatory Visit (INDEPENDENT_AMBULATORY_CARE_PROVIDER_SITE_OTHER): Payer: Self-pay | Admitting: Obstetrics and Gynecology

## 2020-05-08 ENCOUNTER — Encounter: Payer: Self-pay | Admitting: Obstetrics and Gynecology

## 2020-05-08 VITALS — BP 110/73 | HR 107 | Wt 189.0 lb

## 2020-05-08 DIAGNOSIS — O98413 Viral hepatitis complicating pregnancy, third trimester: Secondary | ICD-10-CM

## 2020-05-08 DIAGNOSIS — O0990 Supervision of high risk pregnancy, unspecified, unspecified trimester: Secondary | ICD-10-CM

## 2020-05-08 DIAGNOSIS — O09523 Supervision of elderly multigravida, third trimester: Secondary | ICD-10-CM | POA: Diagnosis not present

## 2020-05-08 DIAGNOSIS — O98419 Viral hepatitis complicating pregnancy, unspecified trimester: Secondary | ICD-10-CM

## 2020-05-08 DIAGNOSIS — B181 Chronic viral hepatitis B without delta-agent: Secondary | ICD-10-CM

## 2020-05-08 DIAGNOSIS — O360930 Maternal care for other rhesus isoimmunization, third trimester, not applicable or unspecified: Secondary | ICD-10-CM

## 2020-05-08 DIAGNOSIS — Z641 Problems related to multiparity: Secondary | ICD-10-CM

## 2020-05-08 DIAGNOSIS — O0993 Supervision of high risk pregnancy, unspecified, third trimester: Secondary | ICD-10-CM | POA: Diagnosis not present

## 2020-05-08 DIAGNOSIS — Z3A37 37 weeks gestation of pregnancy: Secondary | ICD-10-CM

## 2020-05-08 NOTE — Addendum Note (Signed)
Addended by: Vivien Rota on: 05/08/2020 01:54 PM   Modules accepted: Orders, SmartSet

## 2020-05-08 NOTE — Progress Notes (Signed)
   PRENATAL VISIT NOTE  Subjective:  Angela Boyle is a 39 y.o. [id number] at [redacted]w[redacted]d being seen today for ongoing prenatal care.  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  Patient reports no complaints.  Contractions: Irritability. Vag. Bleeding: None.  Movement: Present. Denies leaking of fluid.   The following portions of the patient's history were reviewed and updated as appropriate: allergies, current medications, past family history, past medical history, past social history, past surgical history and problem list.   Objective:   Vitals:   05/08/20 1331  BP: 110/73  Pulse: (!) 107  Weight: 189 lb (85.7 kg)    Fetal Status: Fetal Heart Rate (bpm): 157   Movement: Present     General:  Alert, oriented and cooperative. Patient is in no acute distress.  Skin: Skin is warm and dry. No rash noted.   Cardiovascular: Normal heart rate noted  Respiratory: Normal respiratory effort, no problems with respiration noted  Abdomen: Soft, gravid, appropriate for gestational age.  Pain/Pressure: Absent     Pelvic: Cervical exam deferred        Extremities: Normal range of motion.  Edema: None  Mental Status: Normal mood and affect. Normal behavior. Normal judgment and thought content.   Assessment and Plan:  Pregnancy: [id number] at [redacted]w[redacted]d 1. Chronic hepatitis B affecting antepartum care of mother [name])  2. Multigravida of advanced maternal age in third trimester Regular [name] scheduled  3. Supervision of high risk pregnancy, antepartum  4. 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 [name] multipara  Term labor symptoms and general obstetric precautions including but not limited to vaginal bleeding, contractions, leaking of fluid and fetal movement were reviewed in detail with the patient. Please refer to After Visit Summary for other counseling recommendations.   Return in about 1 week (around 05/15/2020) for high OB, in person.  Future Appointments  Date Time Provider [place]  05/11/2020 11:00 AM [facility] NST [facility]  05/16/2020 10:30 AM [facility] NURSE [facility]  05/16/2020 10:45 AM [facility] US4 [facility]  05/17/2020  9:55 AM Aletha Halim, MD [facility]  05/24/2020 10:55 AM Sloan Leiter, MD [facility]    Sloan Leiter, MD

## 2020-05-10 ENCOUNTER — Encounter: Payer: Self-pay | Admitting: *Deleted

## 2020-05-10 ENCOUNTER — Other Ambulatory Visit: Payer: Self-pay | Admitting: Advanced Practice Midwife

## 2020-05-11 ENCOUNTER — Ambulatory Visit: Payer: Self-pay | Attending: Obstetrics and Gynecology | Admitting: *Deleted

## 2020-05-11 ENCOUNTER — Encounter: Payer: Self-pay | Admitting: *Deleted

## 2020-05-11 ENCOUNTER — Telehealth (HOSPITAL_COMMUNITY): Payer: Self-pay | Admitting: *Deleted

## 2020-05-11 ENCOUNTER — Ambulatory Visit: Payer: Self-pay | Admitting: *Deleted

## 2020-05-11 ENCOUNTER — Other Ambulatory Visit: Payer: Self-pay

## 2020-05-11 ENCOUNTER — Encounter (HOSPITAL_COMMUNITY): Payer: Self-pay | Admitting: *Deleted

## 2020-05-11 DIAGNOSIS — O0990 Supervision of high risk pregnancy, unspecified, unspecified trimester: Secondary | ICD-10-CM

## 2020-05-11 DIAGNOSIS — O471 False labor at or after 37 completed weeks of gestation: Secondary | ICD-10-CM | POA: Insufficient documentation

## 2020-05-11 DIAGNOSIS — O09529 Supervision of elderly multigravida, unspecified trimester: Secondary | ICD-10-CM | POA: Insufficient documentation

## 2020-05-11 DIAGNOSIS — Z641 Problems related to multiparity: Secondary | ICD-10-CM

## 2020-05-11 DIAGNOSIS — Z3A38 38 weeks gestation of pregnancy: Secondary | ICD-10-CM | POA: Insufficient documentation

## 2020-05-11 NOTE — Procedures (Signed)
Angela Boyle [birth year] [redacted]w[redacted]d  Fetus A Non-Stress Test Interpretation for 05/11/20  Indication: Advanced Maternal Age >40 years  Fetal Heart Rate A Mode: External Baseline Rate (A): 145 bpm Variability: Moderate Accelerations: 15 x 15 Decelerations: None Multiple birth?: No  Uterine Activity Mode: Palpation, Toco Contraction Frequency (min): 1-5 Contraction Duration (sec): 30-70 Contraction Quality: Mild Resting Tone Palpated: Relaxed Resting Time: Adequate  Interpretation (Fetal Testing) Nonstress Test Interpretation: Reactive Comments: Reviewed tracing with Dr. Gertie Exon

## 2020-05-11 NOTE — Telephone Encounter (Signed)
Preadmission screen  

## 2020-05-15 ENCOUNTER — Other Ambulatory Visit (HOSPITAL_COMMUNITY)
Admission: RE | Admit: 2020-05-15 | Discharge: 2020-05-15 | Disposition: A | Discharge status: Home / Self Care | Payer: Self-pay | Source: Ambulatory Visit | Attending: Family Medicine | Admitting: Family Medicine

## 2020-05-15 DIAGNOSIS — Z01812 Encounter for preprocedural laboratory examination: Secondary | ICD-10-CM | POA: Insufficient documentation

## 2020-05-15 DIAGNOSIS — Z20822 Contact with and (suspected) exposure to covid-19: Secondary | ICD-10-CM | POA: Insufficient documentation

## 2020-05-15 LAB — SARS CORONAVIRUS 2 (TAT 6-24 HRS): SARS Coronavirus 2: NEGATIVE

## 2020-05-16 ENCOUNTER — Other Ambulatory Visit: Payer: Self-pay

## 2020-05-16 ENCOUNTER — Ambulatory Visit: Payer: Self-pay | Admitting: *Deleted

## 2020-05-16 ENCOUNTER — Other Ambulatory Visit: Payer: Self-pay | Admitting: Maternal & Fetal Medicine

## 2020-05-16 ENCOUNTER — Ambulatory Visit: Payer: Medicaid Other | Attending: Obstetrics and Gynecology

## 2020-05-16 DIAGNOSIS — O09523 Supervision of elderly multigravida, third trimester: Secondary | ICD-10-CM | POA: Diagnosis not present

## 2020-05-16 DIAGNOSIS — O98413 Viral hepatitis complicating pregnancy, third trimester: Secondary | ICD-10-CM

## 2020-05-16 DIAGNOSIS — Z641 Problems related to multiparity: Secondary | ICD-10-CM

## 2020-05-16 DIAGNOSIS — O360930 Maternal care for other rhesus isoimmunization, third trimester, not applicable or unspecified: Secondary | ICD-10-CM | POA: Diagnosis not present

## 2020-05-16 DIAGNOSIS — Z3A38 38 weeks gestation of pregnancy: Secondary | ICD-10-CM

## 2020-05-16 DIAGNOSIS — Z862 Personal history of diseases of the blood and blood-forming organs and certain disorders involving the immune mechanism: Secondary | ICD-10-CM

## 2020-05-16 DIAGNOSIS — O0990 Supervision of high risk pregnancy, unspecified, unspecified trimester: Secondary | ICD-10-CM | POA: Insufficient documentation

## 2020-05-16 DIAGNOSIS — B1910 Unspecified viral hepatitis B without hepatic coma: Secondary | ICD-10-CM | POA: Diagnosis not present

## 2020-05-16 DIAGNOSIS — B181 Chronic viral hepatitis B without delta-agent: Secondary | ICD-10-CM

## 2020-05-17 ENCOUNTER — Inpatient Hospital Stay (HOSPITAL_COMMUNITY): Payer: Medicaid Other | Admitting: Anesthesiology

## 2020-05-17 ENCOUNTER — Inpatient Hospital Stay (HOSPITAL_COMMUNITY): Payer: Medicaid Other

## 2020-05-17 ENCOUNTER — Other Ambulatory Visit: Payer: Self-pay

## 2020-05-17 ENCOUNTER — Encounter (HOSPITAL_COMMUNITY): Payer: Self-pay | Admitting: Obstetrics and Gynecology

## 2020-05-17 ENCOUNTER — Inpatient Hospital Stay (HOSPITAL_COMMUNITY)
Admission: AD | Admit: 2020-05-17 | Discharge: 2020-05-19 | DRG: 806 | Disposition: A | Discharge status: Home / Self Care | Payer: Medicaid Other | Attending: Obstetrics and Gynecology | Admitting: Obstetrics and Gynecology

## 2020-05-17 ENCOUNTER — Encounter: Payer: Self-pay | Admitting: Obstetrics and Gynecology

## 2020-05-17 DIAGNOSIS — O9902 Anemia complicating childbirth: Secondary | ICD-10-CM | POA: Diagnosis present

## 2020-05-17 DIAGNOSIS — Z3A39 39 weeks gestation of pregnancy: Secondary | ICD-10-CM | POA: Diagnosis not present

## 2020-05-17 DIAGNOSIS — D573 Sickle-cell trait: Secondary | ICD-10-CM | POA: Diagnosis present

## 2020-05-17 DIAGNOSIS — Z641 Problems related to multiparity: Secondary | ICD-10-CM

## 2020-05-17 DIAGNOSIS — O360930 Maternal care for other rhesus isoimmunization, third trimester, not applicable or unspecified: Secondary | ICD-10-CM | POA: Diagnosis present

## 2020-05-17 DIAGNOSIS — B181 Chronic viral hepatitis B without delta-agent: Secondary | ICD-10-CM | POA: Diagnosis present

## 2020-05-17 DIAGNOSIS — O9903 Anemia complicating the puerperium: Secondary | ICD-10-CM | POA: Diagnosis not present

## 2020-05-17 DIAGNOSIS — O09529 Supervision of elderly multigravida, unspecified trimester: Secondary | ICD-10-CM

## 2020-05-17 DIAGNOSIS — O0990 Supervision of high risk pregnancy, unspecified, unspecified trimester: Secondary | ICD-10-CM

## 2020-05-17 DIAGNOSIS — O9843 Viral hepatitis complicating the puerperium: Secondary | ICD-10-CM | POA: Diagnosis not present

## 2020-05-17 DIAGNOSIS — O9842 Viral hepatitis complicating childbirth: Secondary | ICD-10-CM | POA: Diagnosis present

## 2020-05-17 DIAGNOSIS — O98419 Viral hepatitis complicating pregnancy, unspecified trimester: Secondary | ICD-10-CM | POA: Diagnosis present

## 2020-05-17 DIAGNOSIS — Z8759 Personal history of other complications of pregnancy, childbirth and the puerperium: Secondary | ICD-10-CM | POA: Diagnosis not present

## 2020-05-17 LAB — CBC
HCT: 36 % (ref 36.0–46.0)
Hemoglobin: 11.9 g/dL — ABNORMAL LOW (ref 12.0–15.0)
MCH: 26.4 pg (ref 26.0–34.0)
MCHC: 33.1 g/dL (ref 30.0–36.0)
MCV: 79.8 fL — ABNORMAL LOW (ref 80.0–100.0)
Platelets: 152 10*3/uL (ref 150–400)
RBC: 4.51 MIL/uL (ref 3.87–5.11)
RDW: 15.7 % — ABNORMAL HIGH (ref 11.5–15.5)
WBC: 7.5 10*3/uL (ref 4.0–10.5)
nRBC: 0 % (ref 0.0–0.2)

## 2020-05-17 LAB — URINALYSIS, ROUTINE W REFLEX MICROSCOPIC
Bilirubin Urine: NEGATIVE
Glucose, UA: NEGATIVE mg/dL
Hgb urine dipstick: NEGATIVE
Ketones, ur: NEGATIVE mg/dL
Leukocytes,Ua: NEGATIVE
Nitrite: NEGATIVE
Protein, ur: NEGATIVE mg/dL
Specific Gravity, Urine: 1.004 — ABNORMAL LOW (ref 1.005–1.030)
pH: 6 (ref 5.0–8.0)

## 2020-05-17 MED ORDER — LACTATED RINGERS IV BOLUS
500.0000 mL | Freq: Once | INTRAVENOUS | Status: AC
  Administered 2020-05-17: 500 mL via INTRAVENOUS

## 2020-05-17 MED ORDER — MISOPROSTOL 50MCG HALF TABLET
ORAL_TABLET | ORAL | Status: AC
  Filled 2020-05-17: qty 1

## 2020-05-17 MED ORDER — OXYCODONE-ACETAMINOPHEN 5-325 MG PO TABS: 1.0000 | ORAL_TABLET | ORAL | Status: DC | PRN

## 2020-05-17 MED ORDER — SOD CITRATE-CITRIC ACID 500-334 MG/5ML PO SOLN: 30.0000 mL | ORAL | Status: DC | PRN

## 2020-05-17 MED ORDER — LACTATED RINGERS IV SOLN: INTRAVENOUS | Status: DC

## 2020-05-17 MED ORDER — EPHEDRINE 5 MG/ML INJ: 10.0000 mg | INTRAVENOUS | Status: DC | PRN

## 2020-05-17 MED ORDER — LIDOCAINE HCL (PF) 1 % IJ SOLN: 30.0000 mL | INTRAMUSCULAR | Status: DC | PRN

## 2020-05-17 MED ORDER — FENTANYL-BUPIVACAINE-NACL 0.5-0.125-0.9 MG/250ML-% EP SOLN
12.0000 mL/h | EPIDURAL | Status: DC | PRN
  Filled 2020-05-17: qty 250

## 2020-05-17 MED ORDER — PHENYLEPHRINE 40 MCG/ML (10ML) SYRINGE FOR IV PUSH (FOR BLOOD PRESSURE SUPPORT): 80.0000 ug | PREFILLED_SYRINGE | INTRAVENOUS | Status: DC | PRN

## 2020-05-17 MED ORDER — OXYTOCIN-SODIUM CHLORIDE 30-0.9 UT/500ML-% IV SOLN: 2.5000 [IU]/h | INTRAVENOUS | Status: DC

## 2020-05-17 MED ORDER — TERBUTALINE SULFATE 1 MG/ML IJ SOLN: 0.2500 mg | Freq: Once | INTRAMUSCULAR | Status: DC | PRN

## 2020-05-17 MED ORDER — MISOPROSTOL 25 MCG QUARTER TABLET: 25.0000 ug | ORAL_TABLET | ORAL | Status: DC | PRN

## 2020-05-17 MED ORDER — LACTATED RINGERS IV SOLN: 500.0000 mL | INTRAVENOUS | Status: DC | PRN

## 2020-05-17 MED ORDER — ACETAMINOPHEN 325 MG PO TABS: 650.0000 mg | ORAL_TABLET | ORAL | Status: DC | PRN

## 2020-05-17 MED ORDER — LACTATED RINGERS IV SOLN
500.0000 mL | Freq: Once | INTRAVENOUS | Status: AC
Start: 2020-05-17 — End: 2020-05-17
  Administered 2020-05-17: 500 mL via INTRAVENOUS

## 2020-05-17 MED ORDER — OXYTOCIN-SODIUM CHLORIDE 30-0.9 UT/500ML-% IV SOLN
1.0000 m[IU]/min | INTRAVENOUS | Status: DC
  Administered 2020-05-17: 2 m[IU]/min via INTRAVENOUS
  Filled 2020-05-17: qty 500

## 2020-05-17 MED ORDER — OXYTOCIN BOLUS FROM INFUSION
333.0000 mL | Freq: Once | INTRAVENOUS | Status: AC
  Administered 2020-05-18: 333 mL via INTRAVENOUS

## 2020-05-17 MED ORDER — DIPHENHYDRAMINE HCL 50 MG/ML IJ SOLN: 12.5000 mg | INTRAMUSCULAR | Status: DC | PRN

## 2020-05-17 MED ORDER — OXYCODONE-ACETAMINOPHEN 5-325 MG PO TABS: 2.0000 | ORAL_TABLET | ORAL | Status: DC | PRN

## 2020-05-17 MED ORDER — MISOPROSTOL 50MCG HALF TABLET
50.0000 ug | ORAL_TABLET | ORAL | Status: DC | PRN
  Administered 2020-05-17: 50 ug via BUCCAL

## 2020-05-17 MED ORDER — LEVONORGESTREL 19.5 MCG/DAY IU IUD: INTRAUTERINE_SYSTEM | Freq: Once | INTRAUTERINE | Status: DC

## 2020-05-17 MED ORDER — ONDANSETRON HCL 4 MG/2ML IJ SOLN: 4.0000 mg | Freq: Four times a day (QID) | INTRAMUSCULAR | Status: DC | PRN

## 2020-05-17 NOTE — H&P (Addendum)
OBSTETRIC ADMISSION HISTORY AND PHYSICAL  Angela Boyle is a 39 y.o. female G1W2993 with IUP at [redacted]w[redacted]d by LMP presenting for IOL for Anti-E isoimmunization and AMA. She reports +FMs, No LOF, no VB, no blurry vision, headaches or peripheral edema, and RUQ pain.  She plans on breastfeeding. She request IUD for birth control. She received her prenatal care at [facility]   Dating: By LMP --->  Estimated Date of Delivery: 05/24/20  Sono:  05/03/2020  [redacted]w[redacted]d, CWD, normal anatomy, cephalic presentation, stable lie, 3268g, 73% EFW   Prenatal History/Complications:  --Sickle cell trait --Anti-E isoimmunization --AMA -- Chronic Hep B  Past Medical History: Past Medical History:  Diagnosis Date  . 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 Sickle cell trait [facility])     Past Surgical History: Past Surgical History:  Procedure Laterality Date  . NO PAST SURGERIES      Obstetrical History: OB History    Gravida  6   Para  5   Term  5   Preterm  0   AB  0   Living  5     SAB  0   TAB  0   Ectopic  0   Multiple  0   Live Births  1           Social History Social History   Socioeconomic History  . Marital status: Married    Spouse name: Abdou  . Number of children: 5  . Years of education: 40  . Highest education level: Not on file  Occupational History  . 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 Smoking status: Never Smoker  . Smokeless tobacco: Never Used  Vaping Use  . Vaping Use: Never used  Substance and Sexual Activity  . Alcohol use: No  . Drug use: No  . Sexual activity: Yes    Birth control/protection: Condom  Other Topics Concern  . Not on file  Social History Narrative   ** Merged History Encounter **   Originally from [place]   Came to [place] In 2005   Lives at home with her husband and 4 youngest children.   Oldest son lives  in [place] with her mother.       Social Determinants of Health   Financial Resource Strain:   . Difficulty of Paying Living Expenses: Not on file  Food Insecurity: No Food Insecurity  . Worried About Charity fundraiser in the Last Year: Never true  . Ran Out of Food in the Last Year: Never true  Transportation Needs: No Transportation Needs  . Lack of Transportation (Medical): No  . Lack of Transportation (Non-Medical): No  Physical Activity:   . Days of Exercise per Week: Not on file  . Minutes of Exercise per Session: Not on file  Stress:   . Feeling of Stress : Not on file  Social Connections:   . Frequency of Communication with Friends and Family: Not on file  . Frequency of Social Gatherings with Friends and Family: Not on file  . Attends Religious Services: Not on file  . Active Member of Clubs or Organizations: Not on file  . Attends Archivist Meetings: Not on file  . Marital Status: Not on file    Family History: Family History  Problem Relation Age of Onset  . Hypertension Mother   .  Hyperlipidemia Mother   . Hypertension Father   . Hypertension Brother   . Sickle cell trait Daughter   . Sickle cell trait Daughter     Allergies: No Known Allergies  Medications Prior to Admission  Medication Sig Dispense Refill Last Dose  . lansoprazole (PREVACID) 15 MG capsule Take 15 mg by mouth daily at 12 noon.   Past Week at Unknown time  . Prenatal Vit-Fe Fumarate-FA (PRENATAL VITAMIN PO) Take by mouth.   05/17/2020 at Unknown time     Review of Systems   All systems reviewed and negative except as stated in HPI  Blood pressure 130/86, pulse 97, temperature 99.3 F (37.4 C), temperature source Oral, resp. rate 18, last menstrual period 08/18/2019, unknown if currently breastfeeding. General appearance: alert, cooperative, appears stated age and no distress Lungs: clear to auscultation bilaterally Heart: regular rate and rhythm Abdomen: soft,  non-tender; bowel sounds normal Pelvic: adequate for delivery Extremities: Homans sign is negative, no sign of DVT Presentation: cephalic Fetal monitoringBaseline: 170 bpm - 500 ml LR bolus & UA ordered Uterine activity: None Dilation: 2 Effacement (%): 60 Station: -3 Exam by:: Jasmine, SNM  Patient tolerated exam well.    Prenatal labs: ABO, Rh: B/Positive/-- (07/12 0000) Antibody: Positive (07/12 0000) Rubella: Immune (07/12 0000) RPR: Non Reactive (10/11 1000)  HBsAg: Positive (07/12 0000)  HIV: Non Reactive (10/11 1000)  GBS: Negative/-- (11/08 1523)  1 hr Glucose 125 on 01/03/20 2 hr Glucola 117  Prenatal Transfer Tool  Maternal Diabetes: No Genetic Screening: Declined Maternal Ultrasounds/Referrals: Normal Fetal Ultrasounds or other Referrals:  None Maternal Substance Abuse:  No Significant Maternal Medications:  None Significant Maternal Lab Results: Group B Strep negative  No results found for this or any previous visit (from the past 24 hour(s)).  Patient Active Problem List   Diagnosis Date Noted  . [place] multipara 05/01/2020  . Advanced maternal age in multigravida 05/01/2020  . 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 Supervision of high risk pregnancy, antepartum 01/19/2020  . 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 Sickle cell trait ([facility])     Assessment/Plan:  Angela Boyle is a 39 y.o. [id number] at [redacted]w[redacted]d here for IOL for Anti-E isoimmunization and AMA.   #Labor: Induction of labor initiated - Cytotec 50 mcg buccal ordered #Pain: Desired epidural in active labor #FWB: Cat I #ID:  GBS negative #MOF: Breastfeeding #MOC: IUD #Circ:  Yes  Valli Glance, Student-MidWife  05/17/2020, 5:55 PM  Attestation of Supervision of Student:  I confirm that I have verified the information documented in the nurse midwife student's note and that I have also personally performed the  history, physical exam and all medical decision making activities.  I have verified that all services and findings are accurately documented in this student's note; and I agree with management and plan as outlined in the documentation. I have also made any necessary editorial change  Pt will get IUD at pp appointment.  Gabriel Carina, [place] for [employer], [employer] 05/19/2020 11:47 AM

## 2020-05-17 NOTE — Progress Notes (Deleted)
S: Doing well, pain 0/10, desires epidural once labor begins, denies pelvic pressure, denies vaginal bleeding and leaking of fluid, denies contractions or cramping.   O: Vitals:   05/17/20 1609  BP: 130/86  Pulse: 97  Resp: 18  Temp: 99.4 F (37.4 C)  TempSrc: Oral     FHT:  FHR: 150 bpm, variability: moderate,  accelerations:  Present,  decelerations:  Absent and   UC:   occassional SVE:   Dilation: 2 Effacement (%): 60 Station: -3 Exam by:: Arrick Dutton, SNM   A / P: Induction of labor for Anti-E isoimmunization and AMA.  --Cytotec 50 mcg buccal ordered  Fetal Wellbeing:  Category I Pain Control:  Desires epidural once labor begins  Anticipated MOD:  NSVD  Angela Boyle 05/17/2020, 5:31 PM

## 2020-05-17 NOTE — Progress Notes (Signed)
Angela Boyle is a 39 y.o. G3O7564 at [redacted]w[redacted]d by LMP admitted for induction of labor due to iso-E immunization.  Subjective: Pt feeling mild intermittent contractions, increasing in intensity.    Objective: BP 137/85   Pulse 91   Temp 97.6 F (36.4 C) (Oral)   Resp 18   Ht 5\' 4"  (1.626 m)   Wt 86.6 kg   LMP 08/18/2019   BMI 32.77 kg/m  No intake/output data recorded. No intake/output data recorded.  FHT:  FHR: 145 bpm, variability: moderate,  accelerations:  Present,  decelerations:  Absent UC:   irregular, every 2-6 minutes SVE:   Dilation: 2.5 Effacement (%): 60 Station: -3 Exam by:: Lattie Haw, CNM  Labs: Lab Results  Component Value Date   WBC 7.5 05/17/2020   HGB 11.9 (L) 05/17/2020   HCT 36.0 05/17/2020   MCV 79.8 (L) 05/17/2020   PLT 152 05/17/2020    Assessment / Plan: Induction of labor due to iso-E immunization  Labor: Discussed options with patient including additional Cytotec, foley ballon placement or Pitocin. Given grand multiparity, soft favorable cervix, will start Pitocin at 2 at this time Preeclampsia:  n/a Fetal Wellbeing:  Category I Pain Control:  none I/D:  GBS neg Anticipated MOD:  NSVD  Fatima Blank 05/17/2020, 10:43 PM

## 2020-05-17 NOTE — Progress Notes (Signed)
Angela Boyle is a 39 y.o. [id number] at [redacted]w[redacted]d by LMP admitted for induction of labor due to iso-E immunization.  Subjective: Pt feeling some mild cramping with Cytotec, husband in room for support.  Objective: BP 137/85   Pulse 91   Temp 97.6 F (36.4 C) (Oral)   Resp 18   Ht 5\' 4"  (1.626 m)   Wt 86.6 kg   LMP 08/18/2019   BMI 32.77 kg/m  No intake/output data recorded. No intake/output data recorded.  FHT:  FHR: 145 bpm, variability: moderate,  accelerations:  Present,  decelerations:  Present isolated deceleration down to 120s lasting 1 minute, occured while pt in bathroom, resolved with position change UC:   irregular, every 2-6 minutes, toco readjusted for improved tracing  SVE:   Dilation: 2 Effacement (%): 60 Station: -3 Exam by:: Jasmine, SNM  Labs: Lab Results  Component Value Date   WBC 7.5 05/17/2020   HGB 11.9 (L) 05/17/2020   HCT 36.0 05/17/2020   MCV 79.8 (L) 05/17/2020   PLT 152 05/17/2020    Assessment / Plan: Induction of labor due to iso-E immunization  Labor: Progressing normally Preeclampsia:  n/a Fetal Wellbeing:  Category I  Overall category I with isolated decel Pain Control:  Labor support without medications I/D:  GBS neg Anticipated MOD:  NSVD  Angela Boyle 05/17/2020, 10:01 PM

## 2020-05-18 ENCOUNTER — Encounter (HOSPITAL_COMMUNITY): Payer: Self-pay | Admitting: Obstetrics and Gynecology

## 2020-05-18 DIAGNOSIS — O360930 Maternal care for other rhesus isoimmunization, third trimester, not applicable or unspecified: Secondary | ICD-10-CM

## 2020-05-18 DIAGNOSIS — Z3A39 39 weeks gestation of pregnancy: Secondary | ICD-10-CM

## 2020-05-18 LAB — RPR: RPR Ser Ql: NONREACTIVE

## 2020-05-18 MED ORDER — PRENATAL MULTIVITAMIN CH
1.0000 | ORAL_TABLET | Freq: Every day | ORAL | Status: DC
  Administered 2020-05-18 – 2020-05-19 (×2): 1 via ORAL
  Filled 2020-05-18 (×2): qty 1

## 2020-05-18 MED ORDER — COCONUT OIL OIL: 1.0000 "application " | TOPICAL_OIL | Status: DC | PRN

## 2020-05-18 MED ORDER — BENZOCAINE-MENTHOL 20-0.5 % EX AERO: 1.0000 "application " | INHALATION_SPRAY | CUTANEOUS | Status: DC | PRN

## 2020-05-18 MED ORDER — ACETAMINOPHEN 325 MG PO TABS: 650.0000 mg | ORAL_TABLET | ORAL | Status: DC | PRN

## 2020-05-18 MED ORDER — SENNOSIDES-DOCUSATE SODIUM 8.6-50 MG PO TABS
2.0000 | ORAL_TABLET | ORAL | Status: DC
  Administered 2020-05-18: 2 via ORAL
  Filled 2020-05-18: qty 2

## 2020-05-18 MED ORDER — LIDOCAINE HCL (PF) 1 % IJ SOLN
INTRAMUSCULAR | Status: DC | PRN
  Administered 2020-05-17 (×2): 5 mL via EPIDURAL

## 2020-05-18 MED ORDER — WITCH HAZEL-GLYCERIN EX PADS: 1.0000 "application " | MEDICATED_PAD | CUTANEOUS | Status: DC | PRN

## 2020-05-18 MED ORDER — MEASLES, MUMPS & RUBELLA VAC IJ SOLR: 0.5000 mL | Freq: Once | INTRAMUSCULAR | Status: DC

## 2020-05-18 MED ORDER — SODIUM CHLORIDE (PF) 0.9 % IJ SOLN
INTRAMUSCULAR | Status: DC | PRN
  Administered 2020-05-17: 12 mL/h via EPIDURAL

## 2020-05-18 MED ORDER — ONDANSETRON HCL 4 MG/2ML IJ SOLN: 4.0000 mg | INTRAMUSCULAR | Status: DC | PRN

## 2020-05-18 MED ORDER — IBUPROFEN 600 MG PO TABS
600.0000 mg | ORAL_TABLET | Freq: Four times a day (QID) | ORAL | Status: DC
  Administered 2020-05-18 – 2020-05-19 (×6): 600 mg via ORAL
  Filled 2020-05-18 (×6): qty 1

## 2020-05-18 MED ORDER — SIMETHICONE 80 MG PO CHEW: 80.0000 mg | CHEWABLE_TABLET | ORAL | Status: DC | PRN

## 2020-05-18 MED ORDER — DIPHENHYDRAMINE HCL 25 MG PO CAPS: 25.0000 mg | ORAL_CAPSULE | Freq: Four times a day (QID) | ORAL | Status: DC | PRN

## 2020-05-18 MED ORDER — TETANUS-DIPHTH-ACELL PERTUSSIS 5-2.5-18.5 LF-MCG/0.5 IM SUSY: 0.5000 mL | PREFILLED_SYRINGE | Freq: Once | INTRAMUSCULAR | Status: DC

## 2020-05-18 MED ORDER — DIBUCAINE (PERIANAL) 1 % EX OINT: 1.0000 "application " | TOPICAL_OINTMENT | CUTANEOUS | Status: DC | PRN

## 2020-05-18 MED ORDER — ONDANSETRON HCL 4 MG PO TABS: 4.0000 mg | ORAL_TABLET | ORAL | Status: DC | PRN

## 2020-05-18 MED ORDER — MAGNESIUM HYDROXIDE 400 MG/5ML PO SUSP: 30.0000 mL | ORAL | Status: DC | PRN

## 2020-05-18 NOTE — Lactation Note (Signed)
Lactation Consultation Note  Patient Name: Angela Boyle [id number] Date: 05/18/2020   Lactation Consult in on mom.  Spoke with RN, Anda Kraft who reports she will put [facility] consult on infant.   Maternal Data    Feeding    LATCH Score                   Interventions    Lactation Tools Discussed/Used     Consult Status      Angela Boyle 05/18/2020, 8:02 AM

## 2020-05-18 NOTE — Lactation Note (Signed)
This note was copied from a baby's chart. Lactation Consultation Note  Patient Name: Angela Boyle PPIRJ'J Date: 05/18/2020 Reason for consult: Initial assessment;Term   P6 mother whose infant is now 33 hours old.  This is a term infant at 39+1 weeks.  Mother has breast fed her other five children ranging in length from 6 months-2 years.  Her youngest child is now 41 years old.  Mother's feeding preference is breast/bottle.  Mother was breast feeding when I arrived.  Baby had a wide gape and flanged lips at the breast.  Suggested mother feed STS with breast feeding.  Reviewed feeding cues with mother.  She will continue to do hand expression before/after feedings to help facilitate a good milk supply.  Colostrum container provided and milk storage times reviewed.  Finger feeding/spoon feeding demonstrated.  Mother will feed 8-12 times/24 hours or sooner if baby shows feeding cues.  She will call for latch assistance as needed.    Mom made aware of O/P services, breastfeeding support groups, community resources, and our phone # for post-discharge questions.  Mother does not have a DEBP for home use but may contact [facility] if she desires to obtain one.  No support person present at this time.   Maternal Data Formula Feeding for Exclusion: Yes Reason for exclusion: Mother's choice to formula and breast feed on admission Has patient been taught Hand Expression?: Yes Does the patient have breastfeeding experience prior to this delivery?: Yes  Feeding Feeding Type: Breast Fed  LATCH Score Latch: Grasps breast easily, tongue down, lips flanged, rhythmical sucking.  Audible Swallowing: A few with stimulation  Type of Nipple: Everted at rest and after stimulation  Comfort (Breast/Nipple): Soft / non-tender  Hold (Positioning): No assistance needed to correctly position infant at breast.  LATCH Score: 9  Interventions Interventions: Breast feeding basics reviewed;Skin to skin;Breast  massage;Hand express;Breast compression;Adjust position;Position options;Support pillows  Lactation Tools Discussed/Used WIC Program: Yes   Consult Status Consult Status: Follow-up Date: 05/19/20 Follow-up type: In-patient    Angela Boyle 05/18/2020, 12:44 PM

## 2020-05-18 NOTE — Discharge Summary (Signed)
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 yo [id number] at 110w1d was admitted to the hospital 05/17/2020 for induction of labor.  Indication for induction: iso E immunization.  Patient had an uncomplicated labor course as follows: Membrane Rupture Time/Date: 2:04 AM ,05/18/2020   Delivery Method:Vaginal, Spontaneous  Episiotomy: None  Lacerations:  None  Details of delivery can be found in separate delivery note.  Patient had a routine postpartum course. Patient is discharged home 05/19/20.  Newborn Data: Birth date:05/18/2020  Birth time:2:07 AM  Gender:Female  Living status:Living  Apgars:9 ,9  Weight:2940 g   Magnesium Sulfate received: No BMZ received: No Rhophylac:No MMR:No T-DaP:Given prenatally Flu: No Transfusion:No  Physical exam  Vitals:   05/18/20 1316 05/18/20 1646 05/18/20 2340 05/19/20 0507  BP: 111/67 120/73 113/71 118/77  Pulse: 74 81 84 78  Resp:  $Remo'18 18 18  'xOArr$ Temp: 98.4 F (36.9 C) 98.5 F (36.9 C) 98.4 F (36.9 C)  97.8 F (36.6 C)  TempSrc: Oral Oral Oral Oral  SpO2: 100% 99% 97% 98%  Weight:      Height:       General: alert, cooperative and no distress Lochia: appropriate Uterine Fundus: firm Incision: N/A DVT Evaluation: No evidence of DVT seen on physical exam. Labs: Lab Results  Component Value Date   WBC 7.5 05/17/2020   HGB 11.9 (L) 05/17/2020   HCT 36.0 05/17/2020   MCV 79.8 (L) 05/17/2020   PLT 152 05/17/2020   CMP Latest Ref Rng & Units 01/19/2020  Glucose 65 - 99 mg/dL 63(L)  BUN 6 - 24 mg/dL 5(L)  Creatinine 0.57 - 1.00 mg/dL 0.47(L)  Sodium 134 - 144 mmol/L 135  Potassium 3.5 - 5.2 mmol/L 3.9  Chloride 96 - 106 mmol/L 101  CO2 20 - 29 mmol/L 22  Calcium 8.7 - 10.2 mg/dL 9.1  Total Protein 6.0 - 8.5 g/dL 6.2  Total Bilirubin 0.0 - 1.2 mg/dL 0.3  Alkaline Phos 48 - 121 IU/L 44(L)  AST 0 - 40 IU/L 14  ALT 0 - 32 IU/L 17   Edinburgh Score: Edinburgh Postnatal Depression Scale Screening Tool 05/18/2020  I have been able to laugh and see the funny side of things. (No Data)     After visit meds:  Allergies as of 05/19/2020   No Known Allergies     Medication List    TAKE these medications   acetaminophen 325 MG tablet  Commonly known as: Tylenol Take 2 tablets (650 mg total) by mouth every 4 (four) hours as needed (for pain scale < 4).   coconut oil Oil Apply 1 application topically as needed.   ibuprofen 600 MG tablet Commonly known as: ADVIL Take 1 tablet (600 mg total) by mouth every 6 (six) hours.   lansoprazole 15 MG capsule Commonly known as: PREVACID Take 15 mg by mouth daily at 12 noon.   PRENATAL VITAMIN PO Take by mouth.        Discharge home in stable condition Infant Feeding: Breast Infant Disposition:home with mother Discharge instruction: per After Visit Summary and Postpartum booklet. Activity: Advance as tolerated. Pelvic rest for 6 weeks.  Diet: routine diet Future Appointments:No future appointments. Follow up  Visit:  Message sent to [facility] on 05/18/20: Please schedule this patient for a In person postpartum visit in 6 weeks with the following provider: Any provider and female preferred. Additional Postpartum F/U:none  High risk pregnancy complicated by: AMA, iso E immunization Delivery mode:  Vaginal, Spontaneous  Anticipated Birth Control:  IUD-needs placement at [facility], no insurance   96/09/5407 Arrie Senate, MD

## 2020-05-18 NOTE — Anesthesia Preprocedure Evaluation (Signed)
Anesthesia Evaluation  Patient identified by MRN, date of birth, ID band Patient awake    Reviewed: Allergy & Precautions, H&P , NPO status , Patient's Chart, lab work & pertinent test results  History of Anesthesia Complications Negative for: history of anesthetic complications  Airway Mallampati: II  TM Distance: >3 FB Neck ROM: full    Dental no notable dental hx. (+) Teeth Intact   Pulmonary neg pulmonary ROS,    Pulmonary exam normal breath sounds clear to auscultation       Cardiovascular negative cardio ROS Normal cardiovascular exam Rhythm:regular Rate:Normal     Neuro/Psych negative neurological ROS  negative psych ROS   GI/Hepatic negative GI ROS, (+) Hepatitis -, B  Endo/Other  negative endocrine ROS  Renal/GU negative Renal ROS  negative genitourinary   Musculoskeletal   Abdominal (+) + obese,   Peds  Hematology  (+) Blood dyscrasia, Sickle cell trait ,   Anesthesia Other Findings   Reproductive/Obstetrics (+) Pregnancy                             Anesthesia Physical Anesthesia Plan  ASA: II  Anesthesia Plan: Epidural   Post-op Pain Management:    Induction:   PONV Risk Score and Plan:   Airway Management Planned:   Additional Equipment:   Intra-op Plan:   Post-operative Plan:   Informed Consent: I have reviewed the patients History and Physical, chart, labs and discussed the procedure including the risks, benefits and alternatives for the proposed anesthesia with the patient or authorized representative who has indicated his/her understanding and acceptance.       Plan Discussed with:   Anesthesia Plan Comments:         Anesthesia Quick Evaluation

## 2020-05-18 NOTE — Anesthesia Procedure Notes (Addendum)
Epidural Patient location during procedure: OB Start time: 05/17/2020 11:18 PM End time: 05/17/2020 11:28 PM  Staffing Anesthesiologist: Murvin Natal, MD Performed: anesthesiologist   Preanesthetic Checklist Completed: patient identified, IV checked, site marked, risks and benefits discussed, monitors and equipment checked, pre-op evaluation and timeout performed  Epidural Patient position: sitting Prep: DuraPrep Patient monitoring: heart rate, cardiac monitor, continuous pulse ox and blood pressure Approach: midline Location: L4-L5 Injection technique: LOR air  Needle:  Needle type: Tuohy  Needle gauge: 17 G Needle length: 9 cm Needle insertion depth: 5 cm Catheter type: closed end flexible Catheter size: 19 Gauge Catheter at skin depth: 10 cm Test dose: negative and Other  Assessment Events: blood not aspirated, injection not painful, no injection resistance and negative IV test  Additional Notes Informed consent obtained prior to proceeding including risk of failure, 1% risk of PDPH, risk of minor discomfort and bruising. Discussed alternatives to epidural analgesia and patient desires to proceed.  Timeout performed pre-procedure verifying patient name, procedure, and platelet count.  Patient tolerated procedure well. Reason for block:procedure for pain

## 2020-05-18 NOTE — Anesthesia Postprocedure Evaluation (Signed)
Anesthesia Post Note  Patient: Angela Boyle  Procedure(s) Performed: AN AD HOC LABOR EPIDURAL     Patient location during evaluation: Mother Baby Anesthesia Type: Epidural Level of consciousness: awake and alert Pain management: pain level controlled Vital Signs Assessment: post-procedure vital signs reviewed and stable Respiratory status: spontaneous breathing Cardiovascular status: stable Postop Assessment: no headache, adequate PO intake, no backache, patient able to bend at knees, able to ambulate, epidural receding and no apparent nausea or vomiting Anesthetic complications: no   No complications documented.  Last Vitals:  Vitals:   05/18/20 0445 05/18/20 0530  BP: 130/70 118/83  Pulse: 69 82  Resp: 18 18  Temp: (!) 36.4 C 36.8 C  SpO2: 100% 100%    Last Pain:  Vitals:   05/18/20 0530  TempSrc: Oral  PainSc: 0-No pain   Pain Goal:                   [name]

## 2020-05-19 DIAGNOSIS — Z8759 Personal history of other complications of pregnancy, childbirth and the puerperium: Secondary | ICD-10-CM

## 2020-05-19 DIAGNOSIS — O9843 Viral hepatitis complicating the puerperium: Secondary | ICD-10-CM

## 2020-05-19 DIAGNOSIS — D573 Sickle-cell trait: Secondary | ICD-10-CM

## 2020-05-19 DIAGNOSIS — O9903 Anemia complicating the puerperium: Secondary | ICD-10-CM

## 2020-05-19 DIAGNOSIS — B181 Chronic viral hepatitis B without delta-agent: Secondary | ICD-10-CM

## 2020-05-19 MED ORDER — COCONUT OIL OIL: 1.0000 "application " | TOPICAL_OIL | 0 refills | Status: AC | PRN

## 2020-05-19 MED ORDER — IBUPROFEN 600 MG PO TABS: 600.0000 mg | ORAL_TABLET | Freq: Four times a day (QID) | ORAL | 0 refills | Status: AC

## 2020-05-19 MED ORDER — ACETAMINOPHEN 325 MG PO TABS: 650.0000 mg | ORAL_TABLET | ORAL | Status: AC | PRN

## 2020-05-19 NOTE — Discharge Instructions (Signed)

## 2020-05-19 NOTE — Lactation Note (Signed)
This note was copied from a baby's chart. Lactation Consultation Note  Patient Name: Boy Lorrene Graef [id number] Date: 05/19/2020  Baby boy Kovarik now 78 hours old.  Mom reports being d/c today.  Infant with 6 percent weight loss.  Mom breastfeeding leaning over infant.  Infant with pursed lips and shallow latch.  Watched a few minutes.  Inquired about moms nipples.  Mom reports they are sore.  Urged mom to break suction and take him off.  Showed mom how.  Moms nipple slightly lipsticked.  Assisted with sitting mom back and getting her comfortable.  Put stacked up blankets under her feet so she could lay back. Once mom got comfortable assisted with latching infant onto the breast.  Mom reported comfort.  Mom reported she felt better.  Better breastfeeding from infant.  Showed her how his chin should be in, mouth and cheeks touching the breast. Mom thanked [place].  Showed mom how to hand express.  Mom had large drop of colostrum.  Urged mom to hand express and rub expressed mothers milk on nipple and let air dry.  Mom has breastfeeding resource list for home use.Urged mom to call lactation as needed.   Maternal Data    Feeding Feeding Type: Breast Fed  LATCH Score                   Interventions    Lactation Tools Discussed/Used     Consult Status      Younes Degeorge Thompson Caul 05/19/2020, 1:32 PM

## 2020-05-20 LAB — BPAM RBC
Blood Product Expiration Date: 202112162359
Blood Product Expiration Date: 202112162359
Unit Type and Rh: 7300
Unit Type and Rh: 7300

## 2020-05-20 LAB — TYPE AND SCREEN
ABO/RH(D): B POS
Antibody Screen: POSITIVE
Donor AG Type: NEGATIVE
Donor AG Type: NEGATIVE
Unit division: 0
Unit division: 0

## 2020-05-24 ENCOUNTER — Encounter: Payer: Self-pay | Admitting: Obstetrics and Gynecology

## 2020-06-29 ENCOUNTER — Ambulatory Visit: Payer: Self-pay | Admitting: Student

## 2021-06-09 IMAGING — US US MFM OB DETAIL+14 WK
1 series · 15 of 28 positions shown · non-contrast
Comparison: none

[Series 1: us mfm ob detail+14 wk · 92 acquisitions, 15 frames shown]
[im 1/92]
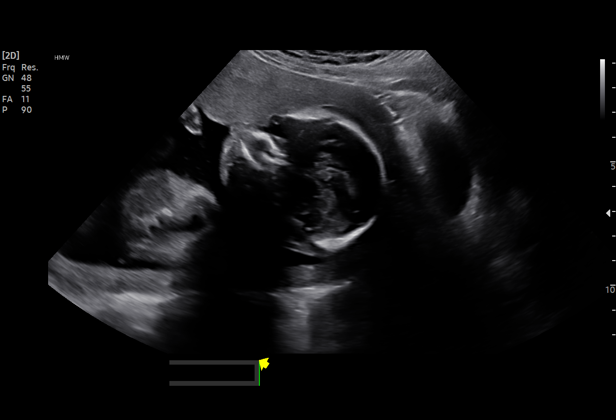
[im 7/92]
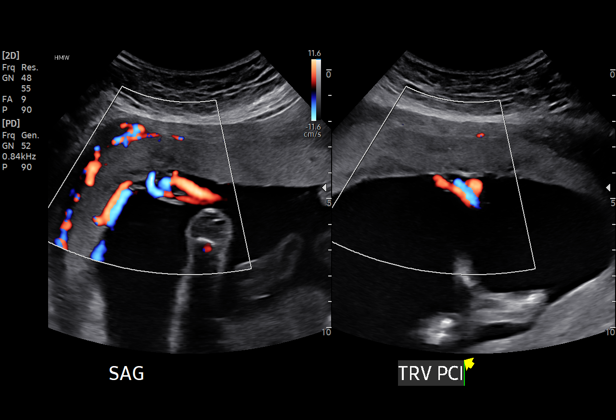
[im 14/92]
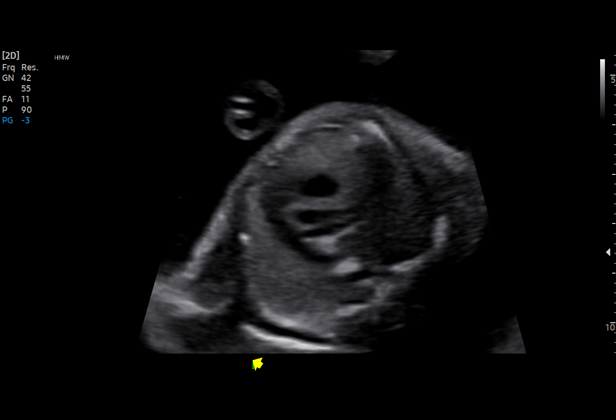
[im 21/92]
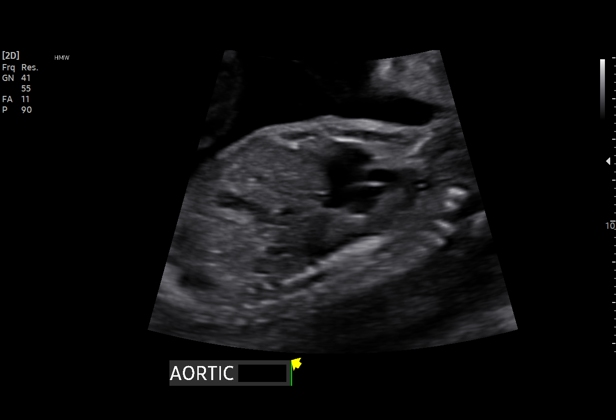
[im 27/92]
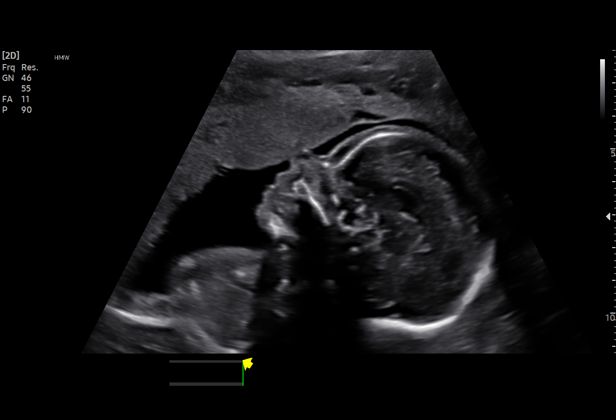
[im 34/92]
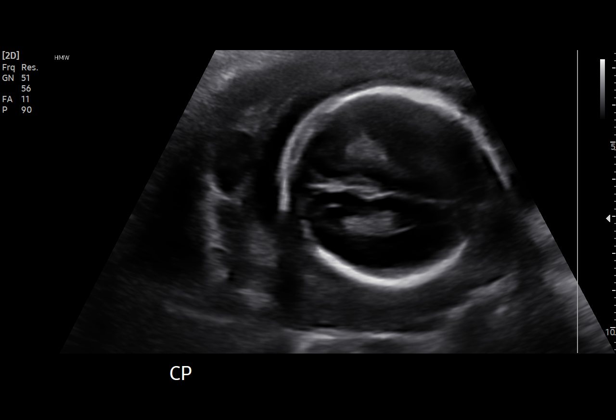
[im 41/92]
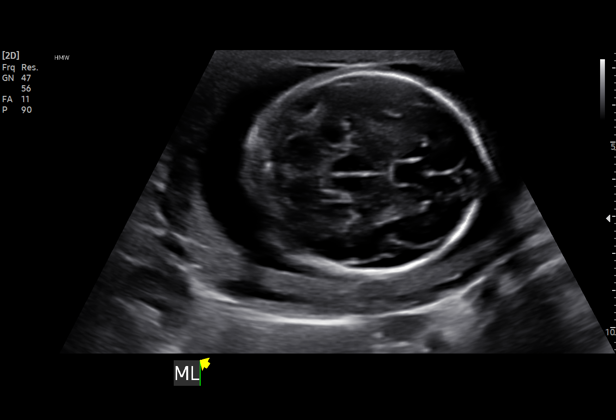
[im 48/92]
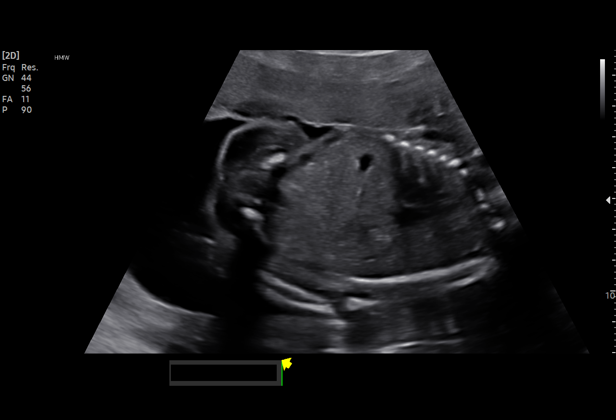
[im 51/92]
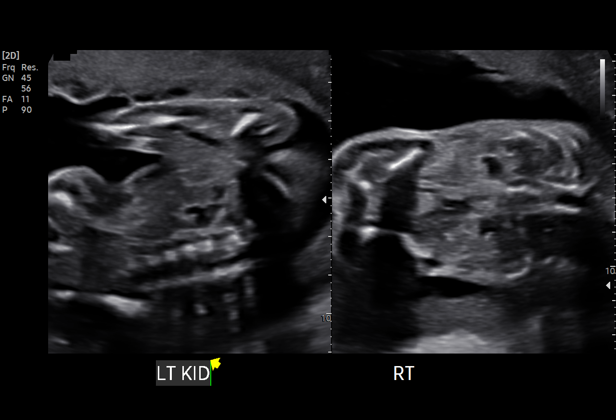
[im 58/92]
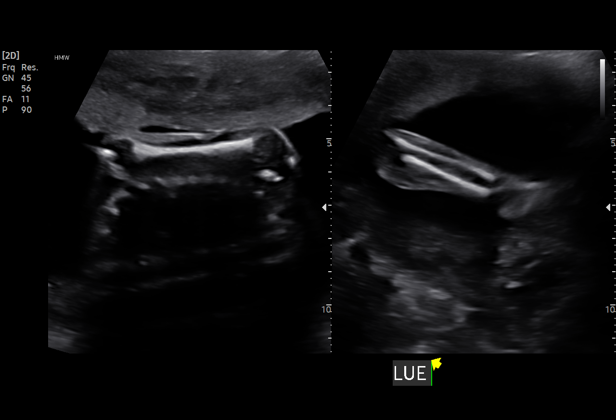
[im 65/92]
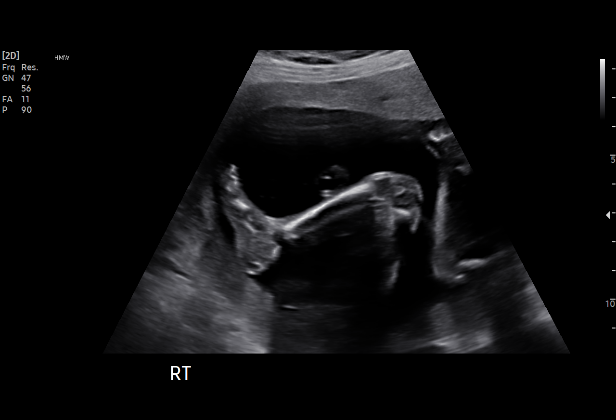
[im 71/92]
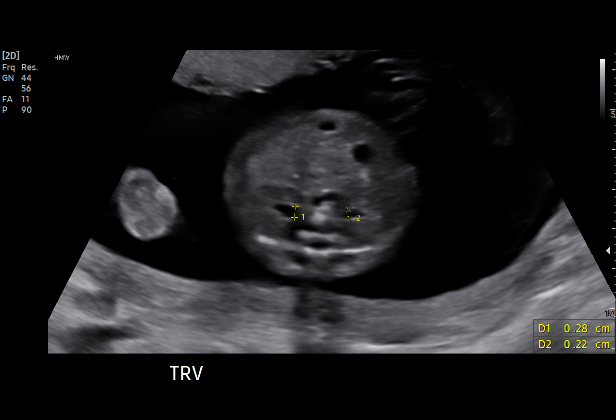
[im 78/92]
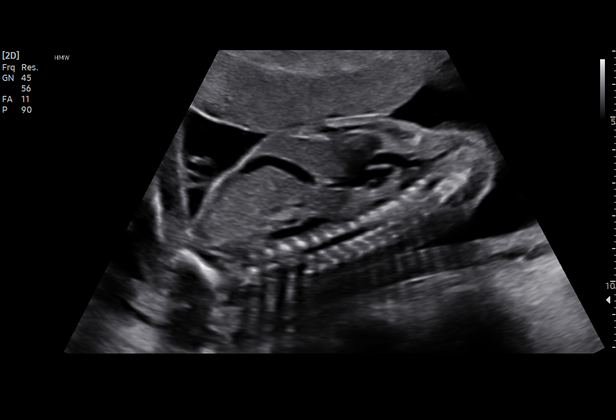
[im 85/92]
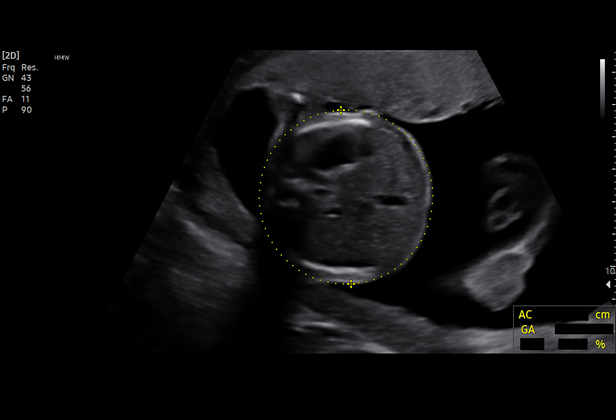
[im 92/92]
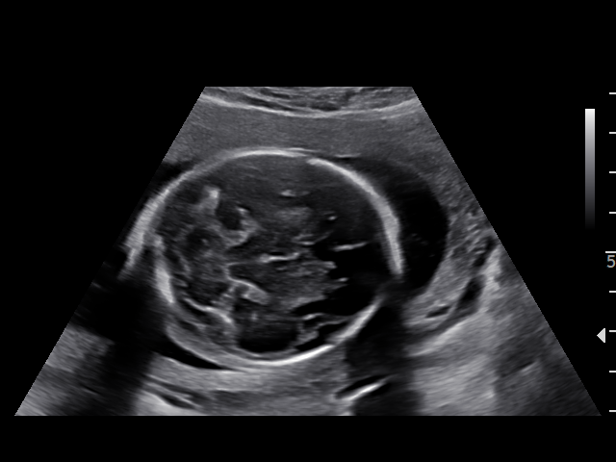

[15 of 28 positions shown; findings below may reference images not displayed]

MAHAMANE

                   [REDACTED]-
                   Faculty Physician

                                                      JIM

Indications

 Advanced maternal age multigravida 35+,
 second trimester
 Anti-E isoimmunization affecting pregnancy
 in 2nd trimester
 Hepatitis B complicating pregnancy             O98.419,
 (Diagnosed in 2002)
 Encounter for antenatal screening for
 malformations
 21 weeks gestation of pregnancy
 Uterine fibroids
 History of sickle cell trait (Patient and 2
 daughters with trait)
Fetal Evaluation

 Num Of Fetuses:         1
 Fetal Heart Rate(bpm):  153
 Cardiac Activity:       Observed
 Presentation:           Cephalic
 Placenta:               Anterior
 P. Cord Insertion:      Visualized, central
 Amniotic Fluid
 AFI FV:      Within normal limits

                             Largest Pocket(cm)

Biometry

 BPD:      53.4  mm     G. Age:  22w 2d         89  %    CI:         78.6   %    70 - 86
                                                         FL/HC:      19.1   %    15.9 -
 HC:      190.5  mm     G. Age:  21w 2d         55  %    HC/AC:      1.16        1.06 -
 AC:      164.8  mm     G. Age:  21w 4d         61  %    FL/BPD:     68.0   %
 FL:       36.3  mm     G. Age:  21w 4d         60  %    FL/AC:      22.0   %    20 - 24
 HUM:      34.9  mm     G. Age:  22w 0d         71  %
 CER:      23.4  mm     G. Age:  21w 5d         82  %

 LV:        7.3  mm
 CM:          4  mm

 Est. FW:     431  gm    0 lb 15 oz      74  %
OB History

 Gravidity:    6         Term:   5        Prem:   0        SAB:   0
 TOP:          0       Ectopic:  0        Living: 5
Gestational Age

 LMP:           21w 0d        Date:  08/18/19                 EDD:   05/24/20
 U/S Today:     21w 5d                                        EDD:   05/19/20
 Best:          21w 0d     Det. By:  LMP  (08/18/19)          EDD:   05/24/20
Anatomy

 Cranium:               Appears normal         Aortic Arch:            Appears normal
 Cavum:                 Previously seen        Ductal Arch:            Appears normal
 Ventricles:            Appears normal         Diaphragm:              Appears normal
 Choroid Plexus:        Appears normal         Stomach:                Appears normal, left
                                                                       sided
 Cerebellum:            Appears normal         Abdomen:                Appears normal
 Posterior Fossa:       Appears normal         Abdominal Wall:         Appears nml (cord
                                                                       insert, abd wall)
 Face:                  Appears normal         Cord Vessels:           Appears normal (3
                        (orbits and profile)                           vessel cord)
 Lips:                  Appears normal         Kidneys:                Appear normal
 Palate:                Appears normal         Bladder:                Appears normal
 Thoracic:              Appears normal         Spine:                  Not well visualized
 Heart:                 Appears normal; EIF    Upper Extremities:      Appears normal
 RVOT:                  Appears normal         Lower Extremities:      Appears normal
 LVOT:                  Appears normal

 Other:  Fetus appears to be a male. Heels and 5th digit visualized. Feet
         visualized. Technically difficult due to fetal position.
Cervix Uterus Adnexa

 Cervix
 Length:           3.83  cm.
 Normal appearance by transabdominal scan.
 Right Ovary
 Not visualized.

 Left Ovary
 Within normal limits.

 Adnexa
 No abnormality visualized.
Comments

 This patient was seen for a detailed fetal anatomy scan due
 to advanced maternal age (39 years old) and chronic
 hepatitis B.  The patient also recently screened positive for
 the anti-E antibody.  This is her sixth pregnancy.  She has
 delivered 5 prior children without any issues.  She denies
 receiving a blood transfusion in the past.
 She denies any other significant past medical history and
 denies any problems in her current pregnancy.
 The patient has not had any screening tests for fetal
 aneuploidy drawn in her current pregnancy.
 She was informed that the fetal growth and amniotic fluid
 level were appropriate for her gestational age.
 On today's exam, an intracardiac echogenic focus was noted
 in the left ventricle of the fetal heart.  The small association
 between an echogenic focus and Down syndrome was
 discussed. Due to the echogenic focus noted today, the
 patient was offered and declined an amniocentesis today for
 definitive diagnosis of fetal aneuploidy.  The patient stated
 that an aneuploid fetus would not change the management or
 outcome of her current pregnancy.
 The patient was informed that anomalies may be missed due
 to technical limitations. If the fetus is in a suboptimal position
 or maternal habitus is increased, visualization of the fetus in
 the maternal uterus may be impaired.
 The implications and management of isoimmunization to the
 E antigen was discussed with the patient today.  As she has
 never received a blood transfusion in the past, she was
 advised that she may have been exposed to the E antigen
 during one of her prior pregnancies.  She reports that this is
 the first time that she has screened positive for the anti-E
 antibody.  Her husband has not been screened to determine
 if he carries the E antigen.  She was advised that should the
 fetus that she is carrying have the E antigen, that the anti-E
 antibodies in her blood may cross over the placenta and
 cause fetal anemia.
 Her husband may be screened to determine if he carries the
 E antigen.  Should he be negative for the E antigen and it is
 certain that he is the father of the baby, then her baby would
 not be at risk for anemia.
 She was reassured that as the anti-E antibody titer level is
 low ([DATE]), her baby is not anemic at this time.  She should
 continue to be followed with monthly anti-E antibody titer
 levels.  Should the anti-E antibody titer levels reach a critical
 titer level of [DATE] or greater or should there be a progressive
 increase in her anti-E antibody titer levels during her
 pregnancy, she should be referred back to our office for
 measurement of the peak systolic velocity of the middle
 cerebral artery for detection of fetal anemia via ultrasound.
 The patient understands that should fetal anemia be
 suspected during her prenatal ultrasounds, that she may
 require in intra uterine fetal blood transfusion or delivery
 depending on her gestational age.
 We will continue to follow her closely with serial ultrasound
 exams throughout her pregnancy.  Please forward her
 monthly anti-E antibody titer levels to our office.
 The pediatricians should be notified at the time of delivery
 regarding her chronic hepatitis B status.  Her baby should
 receive the first dose of the hepatitis B vaccine and the
 hepatitis B immunoglobulin after delivery.
 Due to advanced maternal age (greater than 40 years old),
 weekly fetal testing should be started at around 36 weeks.
 At the end of the consultation, the patient stated that all her
 questions have been answered to her complete satisfaction.
 A total of 40 minutes was spent counseling and coordinating
 the care for this patient.
Recommendations

 Monthly anti-E antibody titer levels
 Her husband may be screened to determine if he carries the
 E antigen
 MCA Doppler studies to screen for fetal anemia should her
 anti-E antibody titer levels reach a critical level ([DATE] or
 greater)
 Monthly growth ultrasounds in our office
 Her baby should receive the hepatitis B vaccine and the
 hepatitis B immunoglobulin after delivery
 Weekly fetal testing starting at 36 weeks

## 2021-07-07 IMAGING — US US MFM OB FOLLOW-UP
1 series · 13 of 28 positions shown · non-contrast
Comparison: none

[Series 1: us mfm ob follow-up · 54 acquisitions, 13 frames shown]
[im 2/54]
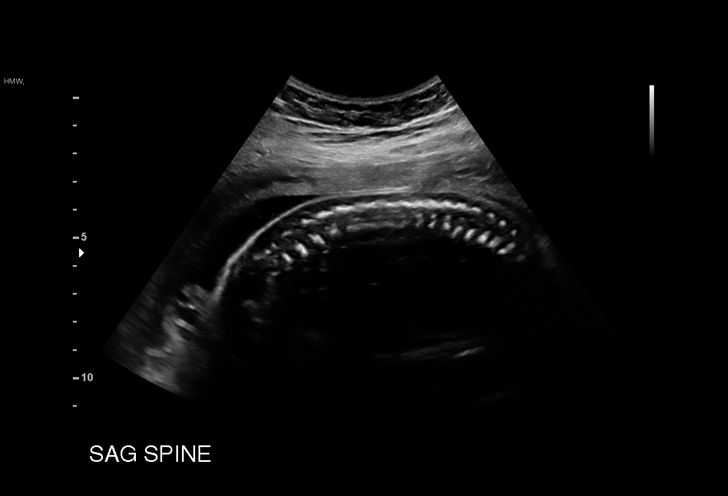
[im 6/54]
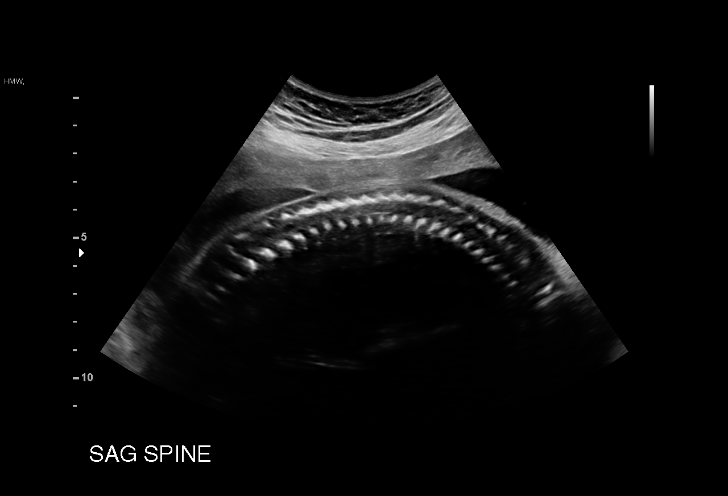
[im 10/54]
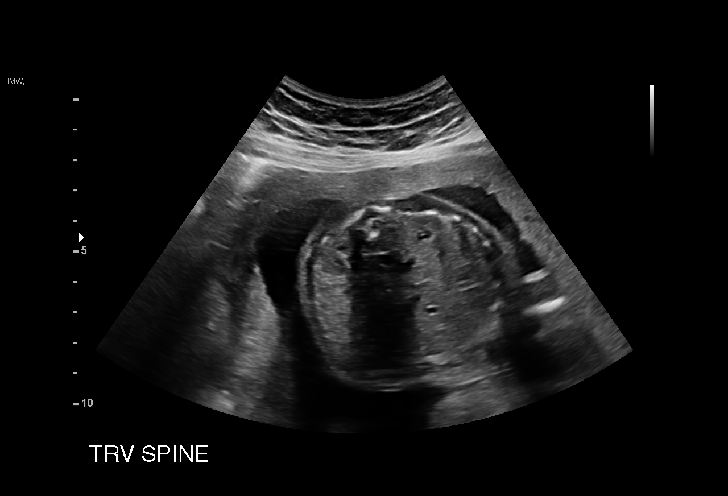
[im 14/54]
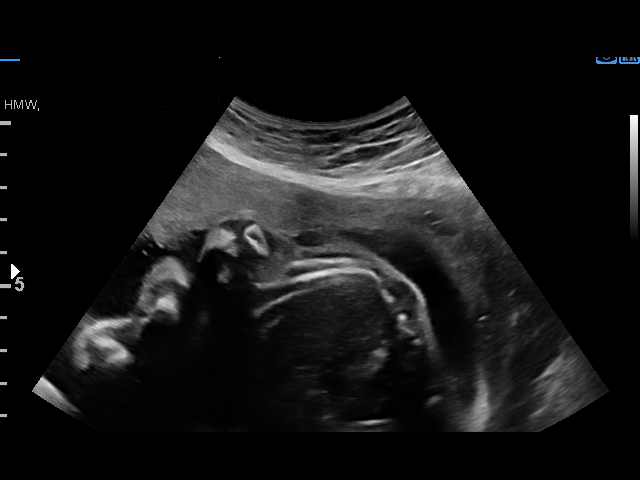
[im 18/54]
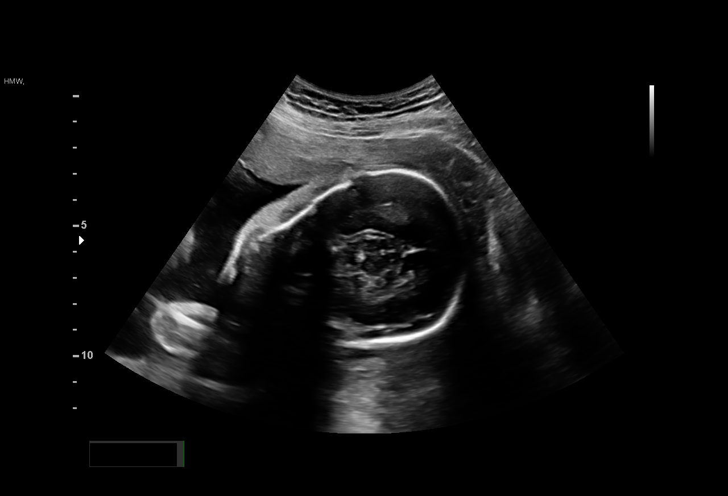
[im 22/54]
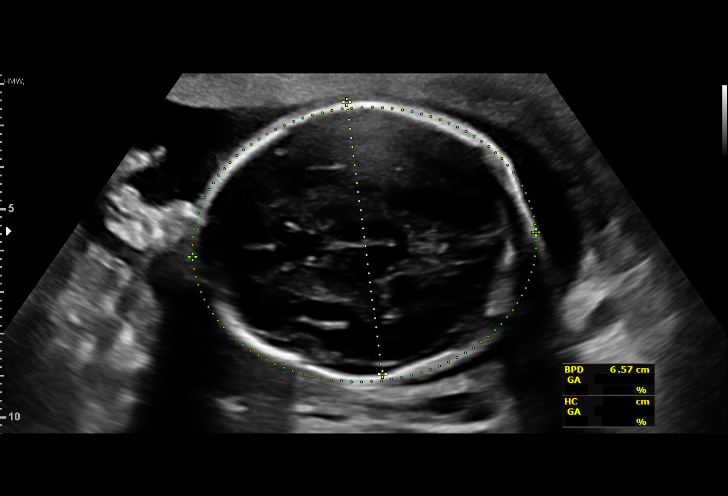
[im 28/54]
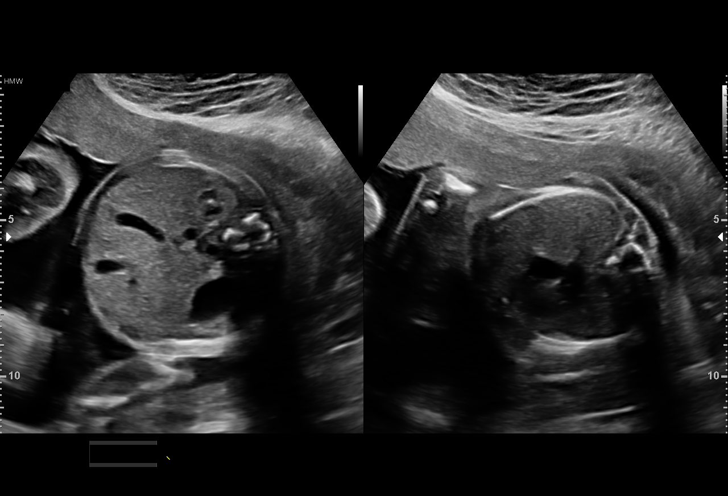
[im 32/54]
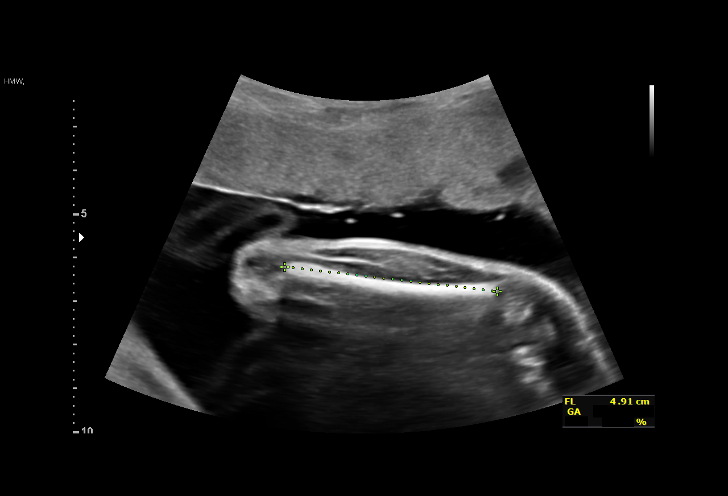
[im 36/54]
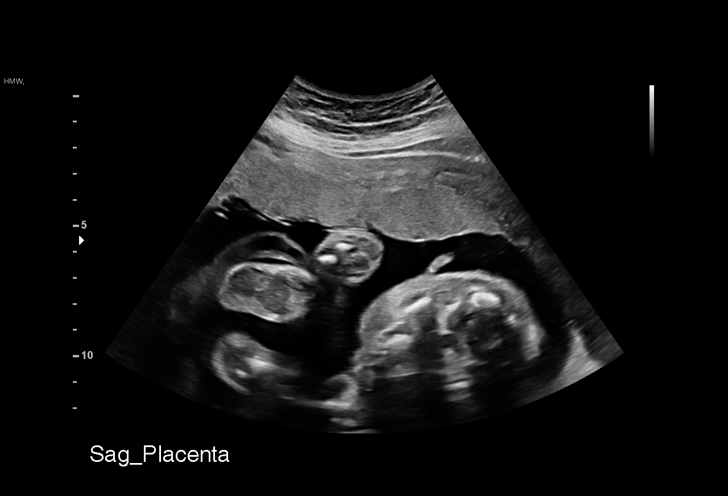
[im 40/54]
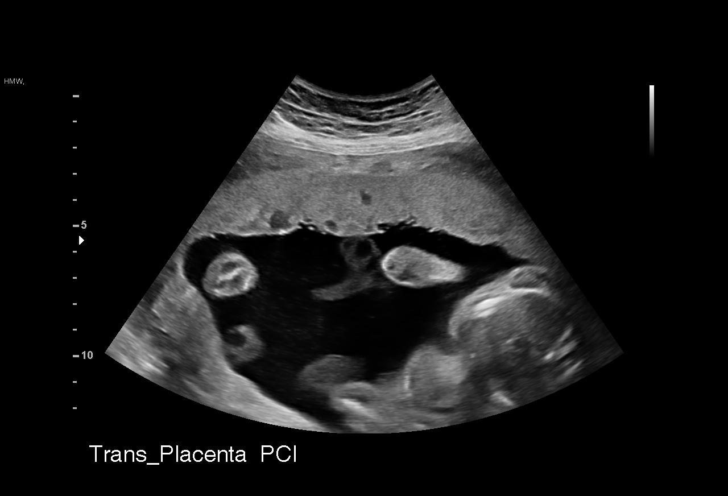
[im 44/54]
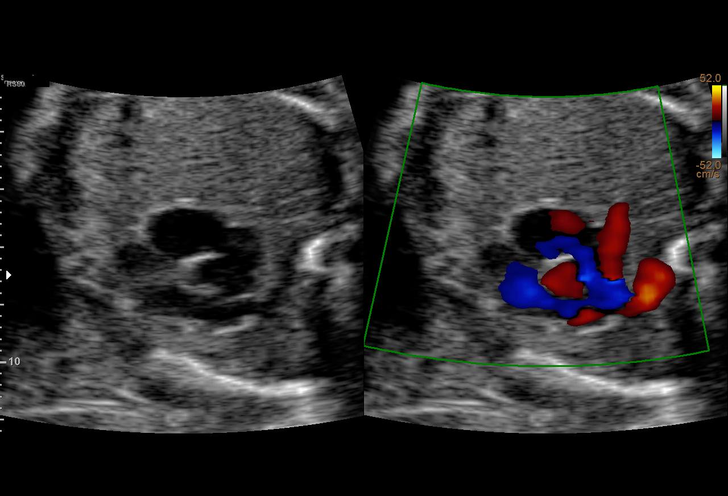
[im 48/54]
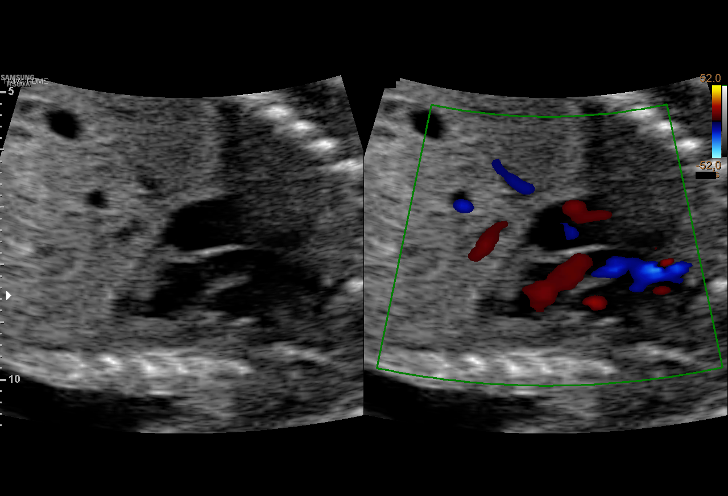
[im 52/54]
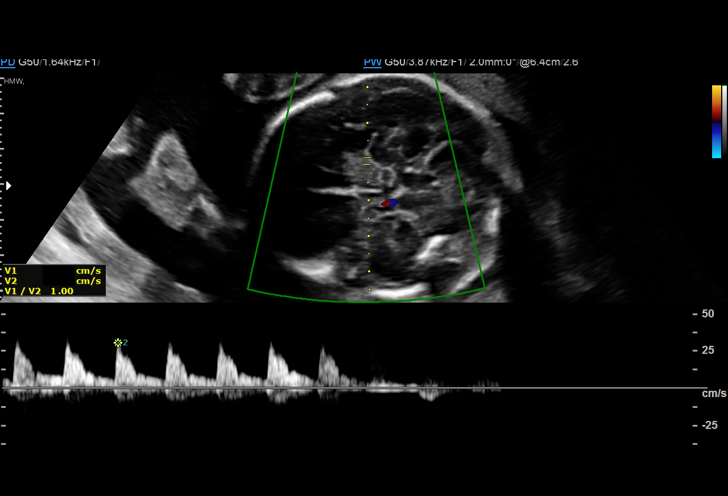

[13 of 28 positions shown; findings below may reference images not displayed]

MAHAMANE

                   [REDACTED]-
                   Faculty Physician

Indications

 25 weeks gestation of pregnancy
 Advanced maternal age multigravida 33+,
 second trimester
 Anti-E isoimmunization affecting pregnancy
 in 2nd trimester
 Hepatitis B complicating pregnancy             O98.419,
 (Diagnosed in 2669)
 History of sickle cell trait (Patient and 2
 daughters with trait)
 Encounter for other antenatal screening
 follow-up
Fetal Evaluation

 Num Of Fetuses:         1
 Cardiac Activity:       Observed
 Presentation:           Cephalic
 Placenta:               Anterior
 P. Cord Insertion:      Visualized, central

 Amniotic Fluid
 AFI FV:      Within normal limits

                             Largest Pocket(cm)

Biometry

 BPD:      65.5  mm     G. Age:  26w 3d         87  %    CI:        80.17   %    70 - 86
                                                         FL/HC:      21.5   %    18.7 -
 HC:      231.1  mm     G. Age:  25w 1d         33  %    HC/AC:      1.05        1.04 -
 AC:      220.4  mm     G. Age:  26w 3d         84  %    FL/BPD:     76.0   %    71 - 87
 FL:       49.8  mm     G. Age:  26w 6d         87  %    FL/AC:      22.6   %    20 - 24

 Est. FW:     940  gm      2 lb 1 oz     94  %
OB History

 Gravidity:    6         Term:   5        Prem:   0        SAB:   0
 TOP:          0       Ectopic:  0        Living: 5
Gestational Age

 LMP:           25w 0d        Date:  08/18/19                 EDD:   05/24/20
 U/S Today:     26w 2d                                        EDD:   05/15/20
 Best:          25w 0d     Det. By:  LMP  (08/18/19)          EDD:   05/24/20
Anatomy

 Cranium:               Appears normal         LVOT:                   Appears normal
 Cavum:                 Appears normal         Aortic Arch:            Previously seen
 Ventricles:            Appears normal         Ductal Arch:            Previously seen
 Choroid Plexus:        Previously seen        Diaphragm:              Appears normal
 Cerebellum:            Previously seen        Stomach:                Appears normal, left
                                                                       sided
 Posterior Fossa:       Previously seen        Abdomen:                Previously seen
 Nuchal Fold:           Not applicable (>20    Abdominal Wall:         Previously seen
                        wks GA)
 Face:                  Orbits and profile     Cord Vessels:           Previously seen
                        previously seen
 Lips:                  Previously seen        Kidneys:                Appear normal
 Palate:                Previously seen        Bladder:                Appears normal
 Thoracic:              Appears normal         Spine:                  Appears normal
 Heart:                 Previously seen        Upper Extremities:      Previously seen
 RVOT:                  Appears normal         Lower Extremities:      Previously seen

 Other:  Fetus appears to be a male. Heels and 5th digit visualized prev. Feet
         visualized prev. Technically difficult due to fetal position.
Doppler - Fetal Vessels

 Middle Cerebral Artery
  S/D                RI               PI    %tile     PSV   MoM
                                                    (cm/s)
 30.39              0.8             1.[REDACTED]

Cervix Uterus Adnexa

 Cervix
 Not visualized (advanced GA >86wks)
 Uterus
 No abnormality visualized.

 Right Ovary
 No adnexal mass visualized.

 Left Ovary
 No adnexal mass visualized.

 Cul De Sac
 No free fluid seen.

 Adnexa
 No abnormality visualized.
Impression

 G6 P5.  Chronic KeppraChronic hepatitis B infection. Recent
 viral DNA is very low (<10 DEYSI/mL). HBe Antigen is negative.
 Patient also had Anti-E antibodies ([DATE] titer).
 Patient also has sickle cell trait.  Obstetric with previous 5
 term vaginal deliveries.

 Patient return for completion of fetal anatomy scan.  Amniotic
 fluid is normal and good fetal activity seen.  Fetal growth is
 appropriate for gestational age.  Fetal spine appears normal.
 Middle cerebral artery Doppler, performed because of anti-E
 antibody titers, showed normal peak systolic velocity
 measurements (no evidence of fetal anemia).

 I reassured the patient of the findings.
Recommendations

 -An appointment was made for her to return in 7 weeks for
 fetal growth assessment and MCA Doppler studies.
 -Weekly BPP from 36 weeks gestation till delivery.
                 Tiger, Apple

## 2022-09-12 ENCOUNTER — Telehealth: Payer: Self-pay

## 2022-09-12 NOTE — Telephone Encounter (Signed)
Telephoned patient at mobile number using interpreter#[phone]. Left a voice message with [employer] Public relations account executive) contact information.
